# Patient Record
Sex: Male | Born: 1970 | Race: Black or African American | Hispanic: No | Marital: Married | Smoking: Never smoker
Health system: Southern US, Community
[De-identification: ages and names within clinical notes are randomized; demographics above are authoritative.]

## PROBLEM LIST (undated history)

## (undated) DIAGNOSIS — K219 Gastro-esophageal reflux disease without esophagitis: Secondary | ICD-10-CM

## (undated) DIAGNOSIS — R351 Nocturia: Secondary | ICD-10-CM

## (undated) DIAGNOSIS — G4700 Insomnia, unspecified: Secondary | ICD-10-CM

## (undated) DIAGNOSIS — R7989 Other specified abnormal findings of blood chemistry: Secondary | ICD-10-CM

## (undated) DIAGNOSIS — R011 Cardiac murmur, unspecified: Secondary | ICD-10-CM

## (undated) DIAGNOSIS — E559 Vitamin D deficiency, unspecified: Secondary | ICD-10-CM

## (undated) HISTORY — DX: Cardiac murmur, unspecified: R01.1

## (undated) HISTORY — PX: COLONOSCOPY: SHX174

## (undated) HISTORY — DX: Nocturia: R35.1

## (undated) HISTORY — DX: Vitamin D deficiency, unspecified: E55.9

## (undated) HISTORY — DX: Insomnia, unspecified: G47.00

## (undated) HISTORY — DX: Gastro-esophageal reflux disease without esophagitis: K21.9

## (undated) HISTORY — DX: Other specified abnormal findings of blood chemistry: R79.89

---

## 2007-12-15 ENCOUNTER — Ambulatory Visit: Payer: Self-pay | Admitting: Cardiology

## 2007-12-15 ENCOUNTER — Ambulatory Visit: Payer: Self-pay

## 2010-03-31 HISTORY — PX: APPENDECTOMY: SHX54

## 2010-04-24 ENCOUNTER — Observation Stay (HOSPITAL_COMMUNITY): Admission: EM | Admit: 2010-04-24 | Discharge: 2010-04-25 | Payer: Self-pay | Admitting: Emergency Medicine

## 2011-02-17 LAB — DIFFERENTIAL
Basophils Absolute: 0 10*3/uL (ref 0.0–0.1)
Eosinophils Relative: 0 % (ref 0–5)
Lymphocytes Relative: 13 % (ref 12–46)
Lymphs Abs: 1.8 10*3/uL (ref 0.7–4.0)
Monocytes Absolute: 1.5 10*3/uL — ABNORMAL HIGH (ref 0.1–1.0)
Monocytes Relative: 11 % (ref 3–12)
Neutro Abs: 10 10*3/uL — ABNORMAL HIGH (ref 1.7–7.7)

## 2011-02-17 LAB — BASIC METABOLIC PANEL
Calcium: 9 mg/dL (ref 8.4–10.5)
GFR calc Af Amer: >60 mL/min (ref >60)
GFR calc non Af Amer: >60 mL/min (ref >60)
Potassium: 3.7 mEq/L (ref 3.5–5.1)
Sodium: 135 mEq/L (ref 135–145)

## 2011-02-17 LAB — CBC
HCT: 42 % (ref 39.0–52.0)
Hemoglobin: 13.7 g/dL (ref 13.0–17.0)
RBC: 5.02 MIL/uL (ref 4.22–5.81)
RDW: 15.2 % (ref 11.5–15.5)
WBC: 13.4 10*3/uL — ABNORMAL HIGH (ref 4.0–10.5)

## 2011-04-15 NOTE — Procedures (Signed)
James Townsend                              EXERCISE James Townsend, James Townsend                          MRN:          [id number]  DATE:12/15/2007                            DOB:          December 14, 1970    INDICATION FOR STUDY:  786.50.   Baseline blood pressure is 114/81, pulse 81 and regular.  Baseline ECG  is normal.   He exercised on a Bruce protocol for 10 minutes completing 1 minute of  stage IV and MET level achieved was 11.7, peak heart rate was 187 beats  per minute which is 101% of maximum heart rate.  Blood pressure went to  196/58 and it came down appropriately in recovery.   He had no chest pain.  Test was stopped secondary to reaching adequate  inpoint.   Electrocardiogram showed no ischemic changes.  There were no  arrhythmias.   CONCLUSION:  1. Negative adequate exercise treadmill.  2. Relatively good exercise tolerance.  3. Physical deconditioning.   Discussed results with the patient.  I have recommended a regular  exercise program and weight reduction.     Thomas C. Daleen Squibb, MD, [facility]  Electronically Signed    TCW/MedQ  DD: 12/15/2007  DT: 12/15/2007  Job #: [id number]   cc:   Florencia Reasons, MD

## 2012-11-01 ENCOUNTER — Ambulatory Visit: Payer: Self-pay | Admitting: Family Medicine

## 2012-11-08 ENCOUNTER — Ambulatory Visit (INDEPENDENT_AMBULATORY_CARE_PROVIDER_SITE_OTHER): Payer: BC Managed Care – PPO | Admitting: Family Medicine

## 2012-11-08 ENCOUNTER — Encounter: Payer: Self-pay | Admitting: Family Medicine

## 2012-11-08 VITALS — BP 120/86 | HR 56 | Temp 98.2°F | Ht 68.5 in | Wt 213.0 lb

## 2012-11-08 DIAGNOSIS — G4700 Insomnia, unspecified: Secondary | ICD-10-CM | POA: Insufficient documentation

## 2012-11-08 DIAGNOSIS — R350 Frequency of micturition: Secondary | ICD-10-CM | POA: Insufficient documentation

## 2012-11-08 DIAGNOSIS — N419 Inflammatory disease of prostate, unspecified: Secondary | ICD-10-CM

## 2012-11-08 DIAGNOSIS — R5383 Other fatigue: Secondary | ICD-10-CM

## 2012-11-08 DIAGNOSIS — R5381 Other malaise: Secondary | ICD-10-CM

## 2012-11-08 LAB — POCT URINALYSIS DIPSTICK
Bilirubin, UA: NEGATIVE
Blood, UA: NEGATIVE
Glucose, UA: NEGATIVE
Ketones, UA: NEGATIVE
Nitrite, UA: NEGATIVE

## 2012-11-08 MED ORDER — CIPROFLOXACIN HCL 500 MG PO TABS: 500.0000 mg | ORAL_TABLET | Freq: Two times a day (BID) | ORAL | Status: DC

## 2012-11-08 NOTE — Progress Notes (Signed)
Chief Complaint  Patient presents with  . Consult    new patient complains of having no energy, not sleeping well and having frequent urination.   HPI: Patient presents with complaint of fatigue.  He thinks it is related to the fact that he doesn't sleep well.  It takes him a long time to fall asleep, sometimes up to 2 hours, and then once asleep, sleep is interrupted by frequent urination, getting up 4 times/night.  He drinks about 16 ounces of fluid in the evenings (after dinnertime), sometimes water, sometimes coffee. He drinks about 3 cups/coffee daily, and 1 other caffeinated beverage daily.  2 servings are before noon, soda at lunch (around 2-3pm), sometimes has another coffee around 6-7pm.  Admits to drinking a lot of water throughout the day. He has urinary frequency during the day, as well as at night.  Denies any burning with urination.  Symptoms of nocturia having been going on for a couple of months.  Problems falling asleep even longer.  Sometimes stream is weaker, sometimes has hesitancy in starting stream.  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  He has trouble falling asleep at night.  He exercises in the mornings.  He has tried chamomille tea without benefit.  He thinks he tried Z-quil without benefit.  He also tried melatonin, can't recall but thinks he felt draggy the next day (he may have gotten those meds/effects mixed up, he really couldn't recall names).  He started taking vitamin D about a month ago, since he isn't out in the sun much.  Hasn't really noticed a difference in energy.  He had labwork done through work in October--didn't bring the results.  He recalls that it was all okay.  Last CPE was 2 years ago at [facility] [place].  Past Medical History  Diagnosis Date  . GERD (gastroesophageal reflux disease)     15 yrs ago, better with dietary changes  . Heart murmur     normal evaluation in past (cardiologist)   Past Surgical  History  Procedure Date  . Appendectomy 03/2010   History   Social History  . Marital Status: Married    Spouse Name: N/A    Number of Children: 3  . Years of Education: N/A   Occupational History  . truck [employer]   Social History Main Topics  . Smoking status: Never Smoker   . Smokeless tobacco: Never Used  . Alcohol Use: No  . Drug Use: No  . Sexually Active: Not on file   Other Topics Concern  . Not on file   Social History Narrative   Lives with wife, 2/3 children (oldest in college).  2 dogs   Family History  Problem Relation Age of Onset  . Cancer Mother     breast cancer in 63's  . Hypertension Maternal Grandmother   . Diabetes Neg Hx   . Stroke Neg Hx   . Heart disease Daughter     "hole in heart", congenital, resolved on its own   Current outpatient prescriptions:Cholecalciferol (VITAMIN D) 1000 UNITS capsule, Take 1,000 Units by mouth daily., Disp: , Rfl:   No Known Allergies  ROS: Denies allergy symptoms, URI symptoms, fevers, nausea, vomiting, diarrhea, abdominal pain, bowel changes, depression, anxiety.  Some constipation. Denies chest pain, palpitations, shortness of breath, edema ,bleeding/bruising, skin rash.  Denies joint pains, neuro complaints, headaches, dizziness or other concerns.  See HPI.  PHYSICAL EXAM: BP 120/86  Pulse 56  Temp 98.2 F (36.8 C) (Oral)  Ht 5' 8.5" (1.74 m)  Wt 213 lb (96.616 kg)  BMI 31.92 kg/m2 Well developed, pleasant, slightly overweight male in no distress HEENT:  PERRL, EOMI, conjunctiva clear.  OP clear Neck: no lymphadenopathy, thyromegaly or mass Heart: regular rate and rhythm with 1-2/6 SEM at apex Lungs: clear bilaterally Back: no CVA or spinal tenderness Abdomen: soft, nontender, no organomegaly or mass Extremities: no edema Skin: no rash Rectal exam: Prostate--moderately enlarged and boggy, nontender  ASSESSMENT/PLAN: 1. Fatigue    2. Urinary frequency  POCT Urinalysis Dipstick  3.  Prostatitis  ciprofloxacin (CIPRO) 500 MG tablet  4. Insomnia      Decrease fluids in the evenings.  Don't have caffeine after noon.   Try melatonin at bedtime to help you fall asleep. Discussed relaxation/calming techniques.  Take cipro twice daily for 2 weeks to treat possible prostate infection that is causing urinary frequency. It is possible that there may also be some underlying enlargement not related to infection.  If your urine symptoms don't improve with the antibiotics, try taking saw palmetto (this is an herbal treatment that helps with symptoms of enlarged prostate). May need to consider rx treatment for BPH if ongoing symptoms.  Call for refill of antibiotic if your symptoms completely resolve, but recur within a week or so after stopping the antibiotic.  Please fax/mail/drop off copies of your recent work labs for my review (to determine if additional bloodwork is necessary).

## 2012-11-08 NOTE — Patient Instructions (Addendum)
Please fax/mail/drop off copies of your recent work labs for my review (to determine if additional bloodwork is necessary).   Decrease fluids in the evenings.  Don't have caffeine after noon.   Try melatonin at bedtime to help you fall asleep. Take cipro twice daily for 2 weeks to treat possible prostate infection that is causing urinary frequency. It is possible that there may also be some underlying enlargement not related to infection.  If your urine symptoms don't improve with the antibiotics, try taking saw palmetto (this is an herbal treatment that helps with symptoms of enlarged prostate)  Call for refill of antibiotic if your symptoms completely resolve, but recur within a week or so after stopping the antibiotic.  Call is if symptoms recur.  Call us if you have NO improvement after 1-2 weeks of medication.   Prostatitis The prostate gland is about the size and shape of a walnut. It is located just below your bladder. It produces one of the components of semen, which is made up of sperm and the fluids that help nourish and transport it out from the testicles. Prostatitis is redness, soreness, and swelling (inflammation) of the prostate gland.  There are 3 types of prostatitis:  Acute bacterial prostatitis This is the least common type of prostatitis. It starts quickly and usually leads to a bladder infection. It can occur at any age.  Chronic bacterial prostatitis This is a persistent bacterial infection in the prostate.It usually develops from repeated acute bacterial prostatitis or acute bacterial prostatitis that was not properly treated. It can occur in men of any age but is most common in middle-aged men whose prostate has begun to enlarge.  Chronic prostatitis chronic pelvic pain syndrome This is the most common type of prostatitis. It is inflammation of the prostate gland that is not caused by a bacterial infection. The cause is unknown. CAUSES The cause of acute and chronic  bacterial prostatitis is a bacterial infection. The exact cause of chronic prostatitis and chronic pelvic pain syndrome and asymptomatic inflammatory prostatitis is unknown.  SYMPTOMS  Symptoms can vary depending upon the type of prostatitis that exists. There can also be overlap in symptoms. Possible symptoms for each type of prostatitis are listed below. Acute bacterial prostatitis  Painful urination.  Fever or chills.  Muscle or joint pains.  Low back pain.  Low abdominal pain.  Inability to empty bladder completely.  Sudden urge to urinate.  Frequent urination.  Difficulty starting urine stream.  Weak urine stream.  Discharge from the urethra.  Dribbling after urination.  Rectal pain.  Pain in the testicles, penis, or tip of the penis.  Pain in the space between the anus and scrotum (perineum).  Problems with sexual function.  Painful ejaculation.  Bloody semen. Chronic bacterial prostatitis  The symptoms are similar to those of acute bacterial prostatitis, but they usually are much less severe. Fever, chills, and muscle and joint pain are not associated with chronic bacterial prostatitis. Chronic prostatitis chronic pelvic pain syndrome  Symptoms typically include a dull ache in the scrotum and the perineum. DIAGNOSIS  In order to diagnose prostatitis, your caregiver will ask about your symptoms. If acute or chronic bacterial prostatitis is suspected, a urine sample will be taken and tested (urinalysis). This is to see if there is bacteria in your urine. If the urinalysis result is negative for bacteria, your caregiver may use a finger to feel your prostate (digital rectal exam). This exam helps your caregiver determine if your prostate  is swollen and tender. TREATMENT  Treatment for prostatitis depends on the cause. If a bacterial infection is the cause, it can be treated with antibiotic medicine. In cases of chronic bacterial prostatitis, the use of antibiotics  for up to 1 month may be necessary. Your caregiver may instruct you to take sitz baths to help relieve pain. A sitz bath is a bath of hot water in which your hips and buttocks are under water. HOME CARE INSTRUCTIONS   Take all medicines as directed by your caregiver.  Take sitz baths as directed by your caregiver. SEEK MEDICAL CARE IF:   Your symptoms get worse, not better.  You have a fever. SEEK IMMEDIATE MEDICAL CARE IF:   You have chills.  You feel nauseous or vomit.  You feel lightheaded or faint.  You are unable to urinate.  You have blood or blood clots in your urine. Document Released: 11/14/2000 Document Revised: 02/09/2012 Document Reviewed: 10/20/2011 [facility] Patient Information 2013 [place], [place].

## 2012-11-25 ENCOUNTER — Other Ambulatory Visit: Payer: Self-pay | Admitting: Family Medicine

## 2012-12-01 DIAGNOSIS — R7989 Other specified abnormal findings of blood chemistry: Secondary | ICD-10-CM

## 2012-12-01 HISTORY — DX: Other specified abnormal findings of blood chemistry: R79.89

## 2013-02-11 ENCOUNTER — Encounter: Payer: Self-pay | Admitting: Family Medicine

## 2013-03-09 ENCOUNTER — Telehealth: Payer: Self-pay | Admitting: Family Medicine

## 2013-03-09 NOTE — Telephone Encounter (Signed)
Received a records request from [facility]/payment was given and records were sent to  [phone]

## 2013-07-25 ENCOUNTER — Ambulatory Visit (INDEPENDENT_AMBULATORY_CARE_PROVIDER_SITE_OTHER): Payer: BC Managed Care – PPO | Admitting: Family Medicine

## 2013-07-25 ENCOUNTER — Encounter: Payer: Self-pay | Admitting: Family Medicine

## 2013-07-25 VITALS — BP 116/70 | HR 56 | Temp 98.1°F | Ht 68.25 in | Wt 221.0 lb

## 2013-07-25 DIAGNOSIS — R5381 Other malaise: Secondary | ICD-10-CM

## 2013-07-25 DIAGNOSIS — G4700 Insomnia, unspecified: Secondary | ICD-10-CM

## 2013-07-25 LAB — COMPREHENSIVE METABOLIC PANEL
ALT: 19 U/L (ref 0–53)
AST: 14 U/L (ref 0–37)
Albumin: 4.4 g/dL (ref 3.5–5.2)
BUN: 11 mg/dL (ref 6–23)
CO2: 31 mEq/L (ref 19–32)
Calcium: 9.1 mg/dL (ref 8.4–10.5)
Chloride: 103 mEq/L (ref 96–112)
Potassium: 4.2 mEq/L (ref 3.5–5.3)

## 2013-07-25 LAB — CBC WITH DIFFERENTIAL/PLATELET
Basophils Absolute: 0 10*3/uL (ref 0.0–0.1)
Basophils Relative: 0 % (ref 0–1)
Hemoglobin: 13.4 g/dL (ref 13.0–17.0)
MCHC: 33.8 g/dL (ref 30.0–36.0)
Neutro Abs: 1.4 10*3/uL — ABNORMAL LOW (ref 1.7–7.7)
Neutrophils Relative %: 42 % — ABNORMAL LOW (ref 43–77)
RDW: 14.8 % (ref 11.5–15.5)
WBC: 3.5 10*3/uL — ABNORMAL LOW (ref 4.0–10.5)

## 2013-07-25 LAB — TSH: TSH: 1.903 u[IU]/mL (ref 0.350–4.500)

## 2013-07-25 MED ORDER — TRAZODONE HCL 50 MG PO TABS: 50.0000 mg | ORAL_TABLET | Freq: Every evening | ORAL | Status: DC | PRN

## 2013-07-25 NOTE — Progress Notes (Signed)
Chief Complaint  Patient presents with  . Fatigue    patient has been very fatigued x 7-8 months but has worsened over the last 2 months. Also cannot sleep very well. And he is having some back pain-mid back, stiffness especially in the morning time x 3 months.    He continues to feel fatigued during the day even if he had rested all weekend, and caught up on sleep.  It takes him about an hour to fall asleep at night.  He gets up twice to go to the bathroom, and often gets back to sleep easily.  He reports he is getting about 4 hours of sleep per night.  Gets to bed around 10:30-11, asleep by midnight, and sleeps until 6:30 in the morning (so likely more than the 4 hrs he states).  He wakes up still feeling a little tired, and recently has remained tired throughout the day (for the last 2-3 weeks).  He thinks he only snores after a hard/long day at work. Wife never mentioned any apnea to him.  He feels he is tired due to not sleeping well enough, not getting uninterrupted sleep for 7-8 hours which he desires.  He has tried chamomille tea, which maybe helped just a little, relaxed him.  He has tried melatonin, which helped some at first, but then stopped working.  He complaints of back stiffness in mornings--discomfort in the lower back when he wakes up, which resolves on its own.  He doesn't think that back pain wakes him up at night or interrupts his sleep.  Denies any radiation of pain, or any numbness or tingling or weakness down the leg.  No dysuria or hematuria.  Past Medical History  Diagnosis Date  . GERD (gastroesophageal reflux disease)     15 yrs ago, better with dietary changes  . Heart murmur     normal evaluation in past (cardiologist)   Past Surgical History  Procedure Laterality Date  . Appendectomy  03/2010   History   Social History  . Marital Status: Married    Spouse Name: N/A    Number of Children: 3  . Years of Education: N/A   Occupational History  . truck [employer]   Social History Main Topics  . Smoking status: Never Smoker   . Smokeless tobacco: Never Used  . Alcohol Use: No  . Drug Use: No  . Sexual Activity: Not on file   Other Topics Concern  . Not on file   Social History Narrative   Lives with wife, 2/3 children (oldest in college).  2 dogs   Current outpatient prescriptions:traZODone (DESYREL) 50 MG tablet, Take 1-2 tablets (50-100 mg total) by mouth at bedtime as needed for sleep., Disp: 60 tablet, Rfl: 2  No Known Allergies  ROS: denies fevers, congestion/allergies/URI symptoms. No cough, shortness of breath, wheezing, chest pain, bleeding/bruising, rashes, joint pains other than the back stiffness per HPI.  Denies depression or anxiety. No bowel changes, hair/skin changes.  He has gained 8 pounds since December.  PHYSICAL EXAM: BP 116/70  Pulse 56  Temp(Src) 98.1 F (36.7 C) (Oral)  Ht 5' 8.25" (1.734 m)  Wt 221 lb (100.245 kg)  BMI 33.34 kg/m2 Pleasant, obese male in no distress HEENT:  PERRL, EOMI, conjunctiva clear.  Sinuses nontender, OP clear Neck: no lymphadenopathy, thyromegaly or mass Back: Spine nontender, no CVA tenderness Heart: regular rate and rhythm without murmur Lungs: clear bilaterally Abdomen: soft, nontender Extremities: no edema Skin: no rashes  Psych: normal mood, affect, hygiene and grooming Neuro: alert and oriented.  Cranial nerves grossly intact. Normal gait.  ASSESSMENT/PLAN:  Insomnia - Plan: traZODone (DESYREL) 50 MG tablet  Other malaise and fatigue - Plan: Comprehensive metabolic panel, CBC with Differential, Vit D  25 hydroxy (rtn osteoporosis monitoring), TSH, Testosterone   Fatigue--partly contributed to inadequate sleep.  Rule out other causes.  Ask wife about snoring and if you stop breathing at night.  If so, you will need a sleep study. I recommend either retrying the melatonin (taking it every night) to help you fall asleep easier, OR trying a medication  containing diphenhydramine (Z-quil, or any of the PM medications, which is really just benadryl).  Take these at 9:30-10pm at night (not at midnight when you can't sleep).  If these aren't helpful, then trial of trazadone.  1-2 tablets qHS.  Sleep hygiene reviewed.  Back pain--briefly reviewed.  Stiffness in morning only, not affecting sleep.  Discussed, heat, stretches, possibly related to mattress.  Tylenol or OTC NSAIDs prn.  Reassured.  25-30 min visit, more than 1/2 spent counseling.

## 2013-07-25 NOTE — Patient Instructions (Addendum)
Insomnia Insomnia is frequent trouble falling and/or staying asleep. Insomnia can be a long term problem or a short term problem. Both are common. Insomnia can be a short term problem when the wakefulness is related to a certain stress or worry. Long term insomnia is often related to ongoing stress during waking hours and/or poor sleeping habits. Overtime, sleep deprivation itself can make the problem worse. Every little thing feels more severe because you are overtired and your ability to cope is decreased. CAUSES   Stress, anxiety, and depression.  Poor sleeping habits.  Distractions such as TV in the bedroom.  Naps close to bedtime.  Engaging in emotionally charged conversations before bed.  Technical reading before sleep.  Alcohol and other sedatives. They may make the problem worse. They can hurt normal sleep patterns and normal dream activity.  Stimulants such as caffeine for several hours prior to bedtime.  Pain syndromes and shortness of breath can cause insomnia.  Exercise late at night.  Changing time zones may cause sleeping problems (jet lag). It is sometimes helpful to have someone observe your sleeping patterns. They should look for periods of not breathing during the night (sleep apnea). They should also look to see how long those periods last. If you live alone or observers are uncertain, you can also be observed at a sleep clinic where your sleep patterns will be professionally monitored. Sleep apnea requires a checkup and treatment. Give your caregivers your medical history. Give your caregivers observations your family has made about your sleep.  SYMPTOMS   Not feeling rested in the morning.  Anxiety and restlessness at bedtime.  Difficulty falling and staying asleep. TREATMENT   Your caregiver may prescribe treatment for an underlying medical disorders. Your caregiver can give advice or help if you are using alcohol or other drugs for self-medication. Treatment  of underlying problems will usually eliminate insomnia problems.  Medications can be prescribed for short time use. They are generally not recommended for lengthy use.  Over-the-counter sleep medicines are not recommended for lengthy use. They can be habit forming.  You can promote easier sleeping by making lifestyle changes such as:  Using relaxation techniques that help with breathing and reduce muscle tension.  Exercising earlier in the day.  Changing your diet and the time of your last meal. No night time snacks.  Establish a regular time to go to bed.  Counseling can help with stressful problems and worry.  Soothing music and white noise may be helpful if there are background noises you cannot remove.  Stop tedious detailed work at least one hour before bedtime. HOME CARE INSTRUCTIONS   Keep a diary. Inform your caregiver about your progress. This includes any medication side effects. See your caregiver regularly. Take note of:  Times when you are asleep.  Times when you are awake during the night.  The quality of your sleep.  How you feel the next day. This information will help your caregiver care for you.  Get out of bed if you are still awake after 15 minutes. Read or do some quiet activity. Keep the lights down. Wait until you feel sleepy and go back to bed.  Keep regular sleeping and waking hours. Avoid naps.  Exercise regularly.  Avoid distractions at bedtime. Distractions include watching television or engaging in any intense or detailed activity like attempting to balance the household checkbook.  Develop a bedtime ritual. Keep a familiar routine of bathing, brushing your teeth, climbing into bed at the same   time each night, listening to soothing music. Routines increase the success of falling to sleep faster.  Use relaxation techniques. This can be using breathing and muscle tension release routines. It can also include visualizing peaceful scenes. You can  also help control troubling or intruding thoughts by keeping your mind occupied with boring or repetitive thoughts like the old concept of counting sheep. You can make it more creative like imagining planting one beautiful flower after another in your backyard garden.  During your day, work to eliminate stress. When this is not possible use some of the previous suggestions to help reduce the anxiety that accompanies stressful situations. MAKE SURE YOU:   Understand these instructions.  Will watch your condition.  Will get help right away if you are not doing well or get worse. Document Released: 11/14/2000 Document Revised: 02/09/2012 Document Reviewed: 12/15/2007 [facility] Patient Information 2014 [place], [place].   Ask wife about snoring and if you stop breathing at night.  If so, you will need a sleep study. I recommend either retrying the melatonin (taking it every night) to help you fall asleep easier, OR trying a medication containing diphenhydramine (Z-quil, or any of the PM medications, which is really just benadryl).  Take these at 9:30-10pm at night (not at midnight when you can't sleep).  If these aren't effective, then try Trazadone.  Take 1-2 tablets at bedtime, if needed for sleep.

## 2013-07-26 ENCOUNTER — Encounter: Payer: Self-pay | Admitting: Family Medicine

## 2013-07-26 DIAGNOSIS — E559 Vitamin D deficiency, unspecified: Secondary | ICD-10-CM | POA: Insufficient documentation

## 2013-07-26 DIAGNOSIS — R7989 Other specified abnormal findings of blood chemistry: Secondary | ICD-10-CM | POA: Insufficient documentation

## 2013-07-26 LAB — VITAMIN D 25 HYDROXY (VIT D DEFICIENCY, FRACTURES): Vit D, 25-Hydroxy: 16 ng/mL — ABNORMAL LOW (ref 30–89)

## 2013-07-27 ENCOUNTER — Other Ambulatory Visit: Payer: Self-pay | Admitting: *Deleted

## 2013-07-27 DIAGNOSIS — E559 Vitamin D deficiency, unspecified: Secondary | ICD-10-CM

## 2013-07-27 DIAGNOSIS — R7989 Other specified abnormal findings of blood chemistry: Secondary | ICD-10-CM

## 2013-07-27 MED ORDER — ERGOCALCIFEROL 1.25 MG (50000 UT) PO CAPS: 50000.0000 [IU] | ORAL_CAPSULE | ORAL | Status: DC

## 2013-07-27 NOTE — Telephone Encounter (Signed)
Called in vit d 50,000IU x 12 weeks, scheduled patient for am labs 11/15/13 @ 8:30 am-future orders placed.

## 2013-11-15 ENCOUNTER — Other Ambulatory Visit: Payer: BC Managed Care – PPO

## 2013-11-15 ENCOUNTER — Telehealth: Payer: Self-pay | Admitting: Family Medicine

## 2013-11-15 DIAGNOSIS — R7989 Other specified abnormal findings of blood chemistry: Secondary | ICD-10-CM

## 2013-11-15 DIAGNOSIS — E559 Vitamin D deficiency, unspecified: Secondary | ICD-10-CM

## 2013-11-15 NOTE — Telephone Encounter (Signed)
He needs to set up a follow-up visit--f/u labs, discuss his fatigue, insomnia

## 2013-11-15 NOTE — Telephone Encounter (Signed)
Patient came in this morning for lab work. He states that the trazodone makes him feel nausea. He said he started the medication then he stop it and now he has tried to restart the medication and it is still making him feel nausea. CLS

## 2013-11-15 NOTE — Telephone Encounter (Signed)
Patient is aware and he will call back to schedule his follow up appointment. CLS

## 2013-11-30 ENCOUNTER — Ambulatory Visit (INDEPENDENT_AMBULATORY_CARE_PROVIDER_SITE_OTHER): Payer: BC Managed Care – PPO | Admitting: Family Medicine

## 2013-11-30 ENCOUNTER — Encounter: Payer: Self-pay | Admitting: Family Medicine

## 2013-11-30 VITALS — BP 132/82 | HR 96 | Ht 69.5 in | Wt 234.0 lb

## 2013-11-30 DIAGNOSIS — R351 Nocturia: Secondary | ICD-10-CM

## 2013-11-30 DIAGNOSIS — E559 Vitamin D deficiency, unspecified: Secondary | ICD-10-CM

## 2013-11-30 DIAGNOSIS — E291 Testicular hypofunction: Secondary | ICD-10-CM

## 2013-11-30 DIAGNOSIS — R7989 Other specified abnormal findings of blood chemistry: Secondary | ICD-10-CM

## 2013-11-30 DIAGNOSIS — G4700 Insomnia, unspecified: Secondary | ICD-10-CM

## 2013-11-30 DIAGNOSIS — Z125 Encounter for screening for malignant neoplasm of prostate: Secondary | ICD-10-CM

## 2013-11-30 MED ORDER — ERGOCALCIFEROL 1.25 MG (50000 UT) PO CAPS: 50000.0000 [IU] | ORAL_CAPSULE | ORAL | Status: DC

## 2013-11-30 MED ORDER — ZOLPIDEM TARTRATE 10 MG PO TABS: 10.0000 mg | ORAL_TABLET | Freq: Every evening | ORAL | Status: DC | PRN

## 2013-11-30 NOTE — Progress Notes (Signed)
Chief Complaint  Patient presents with  . follow-up    follow-up on low testosterone and vitamin D    Insomnia:  He is having trouble falling asleep at night.  He gets up to go to the bathroom 4-5 times/night.  Sometimes he is up more.  He has tried to cut back on fluid intake in the evenings, but sometimes will forget and drink more, or other times just small sips for dry mouth.  He denies urinary frequency during the day (only if he drinks coffee in the morning). When he was here in August, he was only getting up twice a night to void.  10/2012 he was treated with cipro for a prostatitis, when going every 2 hrs. Denies any dysuria, odor to urine.  If he drinks soda, he has more urinary hesitancy to his stream.  Stream is weaker, but no dribbling.  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 If not holding excessively, then seems fine. He tried trazadone to help with sleep--it made him woozy, nauseated, and didn't like how he felt.  He only took it for 2 or 3 nights.   Sometimes he will have a good night sleep, sleeps deeply, and will only get up once to void, and able to get back to sleep.  He did talk to his wife about his sleeping--he snores occasionally, denies apnea.  He had been getting regular exercise--was doing daily, but stopped about 2 months ago.  Hasn't had the energy lately, only exercising about once a week. When he was exercising regularly his sleep was better than it is now.  He used to get up and walk every morning, and he wants to, but can't drag himself to do it, doesn't have the energy now.  Libido is okay, and denies erectile dysfunction.    Vitamin D deficiency.  He believes he only took prescription for 4 weeks (doesn't think bottle had 12 pills, wasn't aware he was supposed to get refills).  He hasn't been taking any OTC vitamin D supplement.  Past Medical History  Diagnosis Date  . GERD  (gastroesophageal reflux disease)     15 yrs ago, better with dietary changes  . Heart murmur     normal evaluation in past (cardiologist)  . Nocturia   . Low testosterone 2014  . Insomnia    Past Surgical History  Procedure Laterality Date  . Appendectomy  03/2010   History   Social History  . Marital Status: Married    Spouse Name: N/A    Number of Children: 3  . Years of Education: N/A   Occupational History  . truck [employer]   Social History Main Topics  . Smoking status: Never Smoker   . Smokeless tobacco: Never Used  . Alcohol Use: No  . Drug Use: No  . Sexual Activity: Not on file   Other Topics Concern  . Not on file   Social History Narrative   Lives with wife, 2/3 children (oldest in college).  2 dogs   No current outpatient prescriptions on file prior to visit.   No current facility-administered medications on file prior to visit.   No Known Allergies  ROS: denies fevers, chills, URI symptoms, cough, shortness of breath, chest pain, GI complaints, GU complaints. +nocturia, +insomnia, +fatigue, as per HPI.  Denies depression, ED, decreased libido, bleeding/bruising, rashes, or other concerns.  PHYSICAL  EXAM: BP 132/82  Pulse 96  Ht 5' 9.5" (1.765 m)  Wt 234 lb (106.142 kg)  BMI 34.07 kg/m2 Well developed, well-appearing, pleasant male in no distress Rectal exam--very tight sphincter tone. Prostate was not enlarged, no nodule or mass could be appreciated.  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  trial of Axiron - Plan: PSA, Prolactin, Testosterone 30 MG/ACT SOLN, CANCELED: PSA, CANCELED: Prolactin  Low testosterone  Unspecified vitamin D deficiency - restart rx replacement x 12 wks, then start OTC supplementation, longterm - Plan:  ergocalciferol (VITAMIN D2) 50000 UNITS capsule  Insomnia - exacerbated by nocturia, but initial insomnia also.  trial of Ambien.  risks/side effects reviewed.  for prn/sporadic use - Plan: zolpidem (AMBIEN) 10 MG tablet  Special screening for malignant neoplasm of prostate - Plan: PSA, CANCELED: PSA  Nocturia - limit caffeine and fluids.  trial of saw palmetto.  no e/o infection on exam  Discussed risks/side effects of ambien and testosterone replacement at length. Discussed increased risk for osteoporosis if not treated (for both).    Start testosterone replacement--will likely need prior auth.  Will be getting [place] insurance effective January 1st. Given sample of Axiron  Unable to get baseline PSA (and prolactin, looking for other signs of pituitary dysfunction), due to [facility] having left for the day--he will return for nonfasting labs soon.  Sleep hygiene reviewed. Encouraged him to restart daily exercise.  Total visit time was 30 minutes, more than 1/2 spent counseling

## 2013-11-30 NOTE — Patient Instructions (Addendum)
Limit fluids and caffeine to help decrease frequency of urination at nighttime. Try taking saw palmetto--a supplement that can sometimes help with prostate symtpoms.  Take ambien at bedtime to help with sleep.  Start at 1/2 tablet, and increase to full tablet if 1/2 isn't effective.  Try and use this medication only as needed.  This is not supposed to be used daily, longterm, just short term or intermittent use.  Hopefully your sleep will improve as you get back to a regular exercise routine.  Your energy should improve with getting vitamin D and testosterone levels back to normal.  It is important to take all 12 weeks of the prescription vitamin D, and then to get an over-the-counter 1000 IU vitamin D, that you will need to take every day longterm (once you finish the prescription).   Return for nonfasting bloodwork within the next week or so (I'm sorry we couldn't draw it today).  Start the Axiron sample you were given, and we will work on getting authorization for a prescription to the pharmacy

## 2013-12-01 ENCOUNTER — Encounter: Payer: Self-pay | Admitting: Family Medicine

## 2013-12-01 DIAGNOSIS — E291 Testicular hypofunction: Secondary | ICD-10-CM | POA: Insufficient documentation

## 2013-12-01 DIAGNOSIS — R351 Nocturia: Secondary | ICD-10-CM | POA: Insufficient documentation

## 2013-12-01 MED ORDER — TESTOSTERONE 30 MG/ACT TD SOLN: TRANSDERMAL | Status: DC

## 2013-12-02 ENCOUNTER — Other Ambulatory Visit: Payer: Managed Care, Other (non HMO)

## 2013-12-02 DIAGNOSIS — E291 Testicular hypofunction: Secondary | ICD-10-CM

## 2013-12-02 DIAGNOSIS — Z125 Encounter for screening for malignant neoplasm of prostate: Secondary | ICD-10-CM

## 2013-12-03 LAB — PROLACTIN: PROLACTIN: 10.2 ng/mL (ref 2.1–17.1)

## 2013-12-03 LAB — PSA: PSA: 0.62 ng/mL (ref <=4.00)

## 2013-12-05 ENCOUNTER — Other Ambulatory Visit: Payer: Self-pay | Admitting: *Deleted

## 2013-12-05 DIAGNOSIS — E291 Testicular hypofunction: Secondary | ICD-10-CM

## 2013-12-05 MED ORDER — TESTOSTERONE 30 MG/ACT TD SOLN: TRANSDERMAL | Status: DC

## 2013-12-05 NOTE — Telephone Encounter (Signed)
Called in.

## 2013-12-12 ENCOUNTER — Telehealth: Payer: Self-pay | Admitting: Family Medicine

## 2013-12-13 NOTE — Telephone Encounter (Signed)
P.A. Randa NgoAxiron approved til 12/12/14, faxed pharmacy and pt informed

## 2014-03-02 ENCOUNTER — Ambulatory Visit (INDEPENDENT_AMBULATORY_CARE_PROVIDER_SITE_OTHER): Payer: Managed Care, Other (non HMO) | Admitting: Family Medicine

## 2014-03-02 ENCOUNTER — Encounter: Payer: Self-pay | Admitting: Family Medicine

## 2014-03-02 VITALS — BP 104/74 | HR 72 | Ht 68.25 in | Wt 218.0 lb

## 2014-03-02 DIAGNOSIS — G4700 Insomnia, unspecified: Secondary | ICD-10-CM

## 2014-03-02 DIAGNOSIS — E559 Vitamin D deficiency, unspecified: Secondary | ICD-10-CM

## 2014-03-02 DIAGNOSIS — E291 Testicular hypofunction: Secondary | ICD-10-CM

## 2014-03-02 DIAGNOSIS — R5381 Other malaise: Secondary | ICD-10-CM | POA: Insufficient documentation

## 2014-03-02 DIAGNOSIS — M545 Low back pain, unspecified: Secondary | ICD-10-CM

## 2014-03-02 DIAGNOSIS — R5383 Other fatigue: Secondary | ICD-10-CM

## 2014-03-02 NOTE — Patient Instructions (Signed)
Try and do back stretches and core strengthening exercises to help your back.  Consider yoga, pilates.    Try and get at least 3 minutes of exercise (cardio) daily.  It is important that you get more sleep.  Your ongoing fatigue is partly related to inadequate sleep.   We will recheck your vitamin D and see if those numbers are improved.  When you finish the prescription, remember to start over-the-counter vitamin D every day, long-term (1000-2000 IU every day).

## 2014-03-02 NOTE — Progress Notes (Signed)
Chief Complaint  Patient presents with  . Hypogonadism    3 month nonfasting med check.    Vitamin D deficiency:  Last Vitamin D-OH was 20.  He reports having taken 2 months of weekly prescription, hasn't picked up the last refill yet. Not taking OTC supplement.  Insomnia:  He takes 1/2 tablet at night prn for insomnia.  He falls asleep better if he doesn't eat anything within 4 hours of going to bed.  Only used the [place] about 2[place]3 times in the last month.  Doesn't have side effects.  Gets up to void about once a night, and can get back to sleep.  He sleeps 4-6 hours per night (averaging 5).  Since cutting back eating at night, he wakes up feeling a little tired, but once he gets up and washes his face his energy seems much better.  He used to have the fatigue persist later in the day.  He feels a little draggy--has some energy but "body feels tired".  Hypogonadism:  He used the Axiron for 2 months, didn't really notice much difference in how he felt, so stopped using it 3 weeks ago.  Never had problems with libido or erectile dysfunction.  Energy hadn't improved at all.   He has lost 16 pounds since his last visit.  He hasn't been getting much exercise, but changed his eating habits.  Wife denied any apnea.  Past Medical History  Diagnosis Date  . GERD (gastroesophageal reflux disease)     15 yrs ago, better with dietary changes  . Heart murmur     normal evaluation in past (cardiologist)  . Nocturia   . Low testosterone 2014  . Insomnia   . Vitamin D deficiency    Past Surgical History  Procedure Laterality Date  . Appendectomy  03/2010   History   Social History  . Marital Status: Married    Spouse Name: N/A    Number of Children: 3  . Years of Education: N/A   Occupational History  . 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 Smoking status: Never Smoker   . Smokeless tobacco: Never Used  . Alcohol Use: No  . Drug Use: No  . Sexual Activity: Not on  file   Other Topics Concern  . Not on file   Social History Narrative   Lives with wife, 2/3 children (oldest in college).  2 dogs   Outpatient Encounter Prescriptions as of 03/02/2014  Medication Sig Note  . zolpidem (AMBIEN) 10 MG tablet Take 1 tablet (10 mg total) by mouth at bedtime as needed for sleep.   . Testosterone 30 MG/ACT SOLN 1 pump to each axilla daily 03/02/2014: Stopped it 3 weeks ago because it wasn't helping  . [DISCONTINUED] ergocalciferol (VITAMIN D2) 50000 UNITS capsule Take 1 capsule (50,000 Units total) by mouth once a week.    No Known Allergies  ROS:  Denies URI or allergy symptoms, chest pain, shortness of breath, heartburn.  +low back pain. No radiation of pain, numbness/tingling.  Denies fevers, chills, or other complaints. See HPI  PHYSICAL EXAM: BP 104/74  Pulse 72  Ht 5' 8.25" (1.734 m)  Wt 218 lb (98.884 kg)  BMI 32.89 kg/m2 Well developed, pleasant male in no distress Neck: no lymphadenopathy, thyromegaly or mass Heart: regular rate and rhythm without murmur Lungs: clear bilaterally Back: spine nontender. No CVA tenderness. R lower paraspinous muscles are mildly tender, no significant spasm noted. Extremities: no edema Psych: normal  mood, affect, hygiene and grooming  ASSESSMENT/PLAN:  Unspecified vitamin D deficiency - Plan: Vit D  25 hydroxy (rtn osteoporosis monitoring)  Hypogonadism male - stopped tx due to no improvement in symptoms.  risks of treatment vs hypogonadism reviewed; okay to remain off for now.  Weight loss encouraged  Other malaise and fatigue  Insomnia - improved  Lumbago - muscular (R paraspinous muscles).  heat, massage, NSAIDs prn, stretches; strengthen core  Recheck vitamin D today. Take the last 4 weeks of prescription.  Reminded to start OTC Vitamin D when prescription is completed.  Encouraged daily exercise (cardio and stretches)  Hypogonadism--he lost weight, which should help the numbers some.  He didn't notice  any improvement on the therapy.  Not checking level today, bc has been off therapy for 3 weeks.  Check again in future, when off longer, if energy doesn't continue to improve.   Insomnia--much improved.  Only sporadic ambien use.  He is not getting enough sleep, which might contribute to fatigue.  Encouraged to get at least 7 hours/night.  Low back pain--encourage stretches and core strengthening.   (asked for note for work to wear sneakers instead of boots--not given, as I'm not convinced that his pain is related to his shoewear)  F/u 6 mo for CPE, sooner prn

## 2014-03-03 LAB — VITAMIN D 25 HYDROXY (VIT D DEFICIENCY, FRACTURES): VIT D 25 HYDROXY: 18 ng/mL — AB (ref 30–89)

## 2014-03-09 ENCOUNTER — Encounter: Payer: BC Managed Care – PPO | Admitting: Family Medicine

## 2014-04-06 ENCOUNTER — Telehealth: Payer: Self-pay | Admitting: Internal Medicine

## 2014-04-06 MED ORDER — VITAMIN D (ERGOCALCIFEROL) 1.25 MG (50000 UNIT) PO CAPS: 50000.0000 [IU] | ORAL_CAPSULE | ORAL | Status: DC

## 2014-04-06 NOTE — Telephone Encounter (Signed)
Pt called and states he never got Vitamin D sent to pharmacy . [facility]

## 2014-04-18 ENCOUNTER — Telehealth: Payer: Self-pay | Admitting: Family Medicine

## 2014-04-18 ENCOUNTER — Other Ambulatory Visit: Payer: Self-pay | Admitting: Family Medicine

## 2014-04-18 DIAGNOSIS — G4700 Insomnia, unspecified: Secondary | ICD-10-CM

## 2014-04-18 MED ORDER — ZOLPIDEM TARTRATE 10 MG PO TABS: 10.0000 mg | ORAL_TABLET | Freq: Every evening | ORAL | Status: DC | PRN

## 2014-04-18 NOTE — Telephone Encounter (Signed)
Pt req Ambien Refill to [facility] [address]

## 2014-04-18 NOTE — Telephone Encounter (Signed)
Last filled 11/30/13.  Okay to refill #30, no add'l refill

## 2014-04-18 NOTE — Telephone Encounter (Signed)
Pt called for Ambien refills to [facility] [address].

## 2014-04-18 NOTE — Telephone Encounter (Signed)
I called out Ambien to the pharmacy per Dr. Lynelle DoctorKnapp and I placed the orders in the system. CLS

## 2014-04-21 ENCOUNTER — Other Ambulatory Visit: Payer: Self-pay | Admitting: Family Medicine

## 2014-04-21 NOTE — Telephone Encounter (Signed)
Dr. Lynelle Doctor it looks like this medication was last sent in on 04/18/14. CLS Is this okay to refill or do you want me to refuse this?

## 2014-04-21 NOTE — Telephone Encounter (Signed)
Your documentation from 5/19 states that you called it in.  Just verify same pharmacy, and then can refuse

## 2014-09-07 ENCOUNTER — Ambulatory Visit (INDEPENDENT_AMBULATORY_CARE_PROVIDER_SITE_OTHER): Payer: Managed Care, Other (non HMO) | Admitting: Family Medicine

## 2014-09-07 ENCOUNTER — Encounter: Payer: Self-pay | Admitting: Family Medicine

## 2014-09-07 VITALS — BP 110/80 | HR 64 | Resp 14 | Ht 68.5 in | Wt 220.0 lb

## 2014-09-07 DIAGNOSIS — Z0000 Encounter for general adult medical examination without abnormal findings: Secondary | ICD-10-CM

## 2014-09-07 DIAGNOSIS — J189 Pneumonia, unspecified organism: Secondary | ICD-10-CM

## 2014-09-07 DIAGNOSIS — G4700 Insomnia, unspecified: Secondary | ICD-10-CM

## 2014-09-07 DIAGNOSIS — E291 Testicular hypofunction: Secondary | ICD-10-CM

## 2014-09-07 DIAGNOSIS — E559 Vitamin D deficiency, unspecified: Secondary | ICD-10-CM

## 2014-09-07 DIAGNOSIS — R351 Nocturia: Secondary | ICD-10-CM

## 2014-09-07 DIAGNOSIS — R5383 Other fatigue: Secondary | ICD-10-CM

## 2014-09-07 LAB — LIPID PANEL
Cholesterol: 140 mg/dL (ref 0–200)
HDL: 47 mg/dL (ref >39)
LDL CALC: 77 mg/dL (ref 0–99)
Total CHOL/HDL Ratio: 3 Ratio
Triglycerides: 78 mg/dL (ref <150)
VLDL: 16 mg/dL (ref 0–40)

## 2014-09-07 LAB — POCT URINALYSIS DIPSTICK
Bilirubin, UA: NEGATIVE
Glucose, UA: NEGATIVE
Ketones, UA: NEGATIVE
Leukocytes, UA: NEGATIVE
NITRITE UA: NEGATIVE
PH UA: 6
PROTEIN UA: NEGATIVE
RBC UA: NEGATIVE
SPEC GRAV UA: 1.015
UROBILINOGEN UA: NEGATIVE

## 2014-09-07 LAB — COMPREHENSIVE METABOLIC PANEL
ALT: 17 U/L (ref 0–53)
AST: 17 U/L (ref 0–37)
Albumin: 4.1 g/dL (ref 3.5–5.2)
Alkaline Phosphatase: 63 U/L (ref 39–117)
BILIRUBIN TOTAL: 0.4 mg/dL (ref 0.2–1.2)
BUN: 12 mg/dL (ref 6–23)
CALCIUM: 8.9 mg/dL (ref 8.4–10.5)
CHLORIDE: 103 meq/L (ref 96–112)
CO2: 27 meq/L (ref 19–32)
CREATININE: 0.99 mg/dL (ref 0.50–1.35)
Glucose, Bld: 85 mg/dL (ref 70–99)
Potassium: 4.5 mEq/L (ref 3.5–5.3)
SODIUM: 139 meq/L (ref 135–145)
TOTAL PROTEIN: 7.2 g/dL (ref 6.0–8.3)

## 2014-09-07 LAB — TSH: TSH: 1.139 u[IU]/mL (ref 0.350–4.500)

## 2014-09-07 NOTE — Progress Notes (Signed)
Chief Complaint  Patient presents with  . Annual Exam    Patient is here for cpe he has his vision checked every two years .He has denied the flu shot because he will get one at work in 2 weeks     James Townsend is a 43 y.o. male who presents for a complete physical.  He has the following concerns:  He went to [facility] 10/5 with fever, chills, cough, muscle aches.  CXR showed R basilar atelectasis vs airspace disease.  There was a nodular density noted in R lower lung zone, felt to be an extension of underlying atelectasis vs airspace disease.  CXR  vs CT recommended in 1 week for recheck.  He was prescribed levaquin and tessalon.  Fevers and myalgias have resolved, but he has persistent cough. He coughs more when he lies down.  Cough had been nonproductive, but just started getting up some "dark stuff".  He took Nyquil for the last 2 nights.  He is having some throat discomfort for the last 3 weeks--feeling like it wants to close up.  Denies postnasal drainage, tongue swelling, denies allergies.  Vitamin D deficiency: Last Vitamin D-OH was 63 in April.  He was treated with another 12 weeks of prescription vitamin D, and has been taking 2000 IU since then.  Insomnia: As long as he doesn't eat/drink for 3 hours before going to bed, he is able to sleep better.  He still intermittently has frequent urination at night which can interfere with his sleep (about once a week). Usually he just gets up once at night, and can fall back asleep.   He hasn't needed to take Ambien in a long time. For the last month he has been feeling fatigued.  Prior to that he had been exercising daily, but just hasn't had the energy to do it for the last month.  Hypogonadism: He used the Axiron for 2 months, didn't really notice much difference in how he felt, so he stopped using it. Never had problems with libido or erectile dysfunction. Energy hadn't improved at all.    There is no immunization history on file for this  patient. He states he had a tetanus shot within 2 years--will try and find out exact date (and type) Will get flu shot at work in 2 weeks Last colonoscopy: once in his 20's (normal) Last PSA: 12/2013 (0.62) Dentist: every 6 months Ophtho: every 2 years, went last year Exercise:  He had been exercising daily, but none over the last month "too tired".  Past Medical History  Diagnosis Date  . GERD (gastroesophageal reflux disease)     15 yrs ago, better with dietary changes  . Heart murmur     normal evaluation in past (cardiologist)  . Nocturia   . Low testosterone 2014  . Insomnia   . Vitamin D deficiency     Past Surgical History  Procedure Laterality Date  . Appendectomy  03/2010    History   Social History  . Marital Status: Married    Spouse Name: N/A    Number of Children: 3  . 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 Smoking status: Never Smoker   . Smokeless tobacco: Never Used  . Alcohol Use: No  . Drug Use: No  . Sexual Activity: Yes    Partners: Female   Other Topics Concern  . Not on file   Social  History Narrative   Lives with wife, 1/3 children (oldest in college, 1 in the [employer]).  2 dogs    Family History  Problem Relation Age of Onset  . Cancer Mother     breast cancer in 58's  . Breast cancer Mother     7's  . Hypertension Maternal Grandmother   . Diabetes Neg Hx   . Stroke Neg Hx   . Heart disease Daughter     "hole in heart", congenital, resolved on its own  . Hypertension Father     Outpatient Encounter Prescriptions as of 09/07/2014  Medication Sig Note  . benzonatate (TESSALON) 200 MG capsule Take 200 mg by mouth 3 (three) times daily as needed for cough.   . cholecalciferol (VITAMIN D) 1000 UNITS tablet Take 2,000 Units by mouth daily.   Marland Kitchen levofloxacin (LEVAQUIN) 500 MG tablet Take 500 mg by mouth daily.   Marland Kitchen zolpidem (AMBIEN) 10 MG tablet Take 1 tablet (10  mg total) by mouth at bedtime as needed for sleep. 09/07/2014: No longer needing  . [DISCONTINUED] Testosterone 30 MG/ACT SOLN 1 pump to each axilla daily 03/02/2014: Stopped it 3 weeks ago because it wasn't helping  . [DISCONTINUED] Vitamin D, Ergocalciferol, (DRISDOL) 50000 UNITS CAPS capsule Take 1 capsule (50,000 Units total) by mouth every 7 (seven) days.     No Known Allergies  ROS:  The patient denies anorexia, weight changes, headaches, vision loss, decreased hearing, ear pain, hoarseness, chest pain, palpitations, dizziness, syncope, dyspnea on exertion, swelling, nausea, vomiting, diarrhea, constipation, abdominal pain, melena, hematochezia, indigestion/heartburn, hematuria, incontinence, erectile dysfunction, weakened urine stream, dysuria, genital lesions, joint pains, numbness, tingling, weakness, tremor, suspicious skin lesions, depression, anxiety, abnormal bleeding/bruising, or enlarged lymph nodes. He regained just 2 pounds over the last 6 months (kept most of weight off that he lost). Intermittent nocturia (weekly--see HPI). Cough/febrile illness--see HPI.  PHYSICAL EXAM: BP 110/80  Pulse 64  Resp 14  Ht 5' 8.5" (1.74 m)  Wt 220 lb (99.791 kg)  BMI 32.96 kg/m2  General Appearance:    Alert, cooperative, no distress, appears stated age. Occasional dry cough.  Speaking easily in full sentences, in no distress  Head:    Normocephalic, without obvious abnormality, atraumatic  Eyes:    PERRL, conjunctiva/corneas clear, EOM's intact, fundi    benign  Ears:    Normal TM's and external ear canals  Nose:   Nares normal, mucosa mildly edematous, no drainage or sinus tenderness  Throat:   Lips, mucosa, and tongue normal; teeth and gums normal  Neck:   Supple, no lymphadenopathy;  thyroid:  no   enlargement/tenderness/nodules; no carotid   bruit or JVD  Back:    Spine nontender, no curvature, ROM normal, no CVA     tenderness  Lungs:     Slight crackles noted at right posterior lung  field.  Good air movement, without wheezes or ronchi; respirations unlabored  Chest Wall:    No tenderness or deformity   Heart:    Regular rate and rhythm, S1 and S2 normal, no murmur, rub   or gallop  Breast Exam:    No chest wall tenderness, masses or gynecomastia  Abdomen:     Soft, non-tender, nondistended, normoactive bowel sounds,    no masses, no hepatosplenomegaly  Genitalia:    Normal male external genitalia without lesions.  Testicles without masses.  No inguinal hernias.  Rectal:    Normal sphincter tone, no masses or tenderness; guaiac negative stool.  Prostate smooth,  no nodules, mildly enlarged.  Extremities:   No clubbing, cyanosis or edema  Pulses:   2+ and symmetric all extremities  Skin:   Skin color, texture, turgor normal, no rashes or lesions  Lymph nodes:   Cervical, supraclavicular, and axillary nodes normal  Neurologic:   CNII-XII intact, normal strength, sensation and gait; reflexes 2+ and symmetric throughout          Psych:   Normal mood, affect, hygiene and grooming.     ASSESSMENT/PLAN:  Annual physical exam - Plan: Lipid panel, Comprehensive metabolic panel, CBC with Differential, TSH  Vitamin D deficiency - Plan: Visual acuity screening, POCT Urinalysis Dipstick, Vit D  25 hydroxy (rtn osteoporosis monitoring)  Hypogonadism male - recheck. if no other cause for fatigue found, is willing to retry testosterone replacement therapy - Plan: Testosterone  Insomnia - resolved/improved with modifications in behavior (avoiding late night eating)  Other fatigue - Plan: Comprehensive metabolic panel, CBC with Differential, TSH  Nocturia - sporadic. avoid caffeine. likely has component of mild BPH.  can consider tryin saw palmetto  Pneumonia, organism unspecified - complete levaquin rx and f/u next week for CXR. May need CT if worsening; understands will likely need add'l f/u in 2-4 weeks (unless repeat is completely nl)   Vit D, c-met, lipid, testost,  TSH  Cough/pneumonia/abnormal chest x-ray.  Continue the course of levaquin, and follow up next week with repeat CXR as recommended.  Use Mucinex as needed to loosen up the phlegm.  You may continue to use dextromethorphan as a cough suppressant (this is in Nyquil, Robitussin, Delsym syrup and Mucinex--make sure that you aren't finding this same ingredient in multiple OTC meds that you are taking--ie don't get Nyquil and Mucinex-DM as they both have same ingredient).  Fatigue-- Pt is willing to retry testosterone replacement (Axiron) if no other cause found.  Explained that we would need to recheck a level (prior to stopping) to see if dose needs to be increased. Exercise  Mild BPH--consider trial of saw palmetto.    Discussed PSA screening (risks/benefits), recommended at least 30 minutes of aerobic activity at least 5 days/week; proper sunscreen use reviewed; healthy diet and alcohol recommendations (less than or equal to 2 drinks/day) reviewed; regular seatbelt use; changing batteries in smoke detectors. Self-testicular exams. Immunization recommendations discussed.  Colonoscopy recommendations reviewed.  F/u based on labs (ie soon if restarting testosterone).  CPE 1 year Repeat CXR as planned on Monday (at [facility], so comparison is available)

## 2014-09-07 NOTE — Patient Instructions (Addendum)
  HEALTH MAINTENANCE RECOMMENDATIONS:  It is recommended that you get at least 30 minutes of aerobic exercise at least 5 days/week (for weight loss, you may need as much as 60-90 minutes). This can be any activity that gets your heart rate up. This can be divided in 10-15 minute intervals if needed, but try and build up your endurance at least once a week.  Weight bearing exercise is also recommended twice weekly.  Eat a healthy diet with lots of vegetables, fruits and fiber.  "Colorful" foods have a lot of vitamins (ie green vegetables, tomatoes, red peppers, etc).  Limit sweet tea, regular sodas and alcoholic beverages, all of which has a lot of calories and sugar.  Up to 2 alcoholic drinks daily may be beneficial for men (unless trying to lose weight, watch sugars).  Drink a lot of water.  Sunscreen of at least SPF 30 should be used on all sun-exposed parts of the skin when outside between the hours of 10 am and 4 pm (not just when at beach or pool, but even with exercise, golf, tennis, and yard work!)  Use a sunscreen that says "broad spectrum" so it covers both UVA and UVB rays, and make sure to reapply every 1-2 hours.  Remember to change the batteries in your smoke detectors when changing your clock times in the spring and fall.  Use your seat belt every time you are in a car, and please drive safely and not be distracted with cell phones and texting while driving.  Continue the course of levaquin, and follow up next week with repeat CXR as recommended.  Use Mucinex as needed to loosen up the phlegm.  You may continue to use dextromethorphan as a cough suppressant (this is in Nyquil, Robitussin, Delsym syrup and Mucinex--make sure that you aren't finding this same ingredient in multiple OTC meds that you are taking--ie don't get Nyquil and Mucinex-DM as they both have same ingredient).  Please check to see when your last tetanus shot was.  Call us with the date (and the type--Td vs TdaP) so we  can enter in our records.  If you are unable to find this information, we may need to give you another one.  Consider trying saw palmetto (for urinary frequency at night, likely related to your prostate being a little enlarged).  Urinary frequency might also be related to caffeine or certain OTC medications, so try and pay attention to any relation to these things which can be avoided.

## 2014-09-08 LAB — CBC WITH DIFFERENTIAL/PLATELET
BASOS ABS: 0 10*3/uL (ref 0.0–0.1)
BASOS PCT: 1 % (ref 0–1)
EOS PCT: 6 % — AB (ref 0–5)
Eosinophils Absolute: 0.2 10*3/uL (ref 0.0–0.7)
HEMATOCRIT: 38.7 % — AB (ref 39.0–52.0)
Hemoglobin: 12.7 g/dL — ABNORMAL LOW (ref 13.0–17.0)
LYMPHS PCT: 49 % — AB (ref 12–46)
Lymphs Abs: 1.6 10*3/uL (ref 0.7–4.0)
MCH: 26.8 pg (ref 26.0–34.0)
MCHC: 32.8 g/dL (ref 30.0–36.0)
MCV: 81.6 fL (ref 78.0–100.0)
MONO ABS: 0.5 10*3/uL (ref 0.1–1.0)
Monocytes Relative: 15 % — ABNORMAL HIGH (ref 3–12)
Neutro Abs: 0.9 10*3/uL — ABNORMAL LOW (ref 1.7–7.7)
Neutrophils Relative %: 29 % — ABNORMAL LOW (ref 43–77)
PLATELETS: 185 10*3/uL (ref 150–400)
RBC: 4.74 MIL/uL (ref 4.22–5.81)
RDW: 15 % (ref 11.5–15.5)
WBC: 3.2 10*3/uL — AB (ref 4.0–10.5)

## 2014-09-08 LAB — VITAMIN D 25 HYDROXY (VIT D DEFICIENCY, FRACTURES): Vit D, 25-Hydroxy: 19 ng/mL — ABNORMAL LOW (ref 30–89)

## 2014-09-08 LAB — PATHOLOGIST SMEAR REVIEW

## 2014-09-08 LAB — TESTOSTERONE: Testosterone: 235 ng/dL — ABNORMAL LOW (ref 300–890)

## 2014-09-11 ENCOUNTER — Other Ambulatory Visit: Payer: Self-pay | Admitting: *Deleted

## 2014-09-11 DIAGNOSIS — E559 Vitamin D deficiency, unspecified: Secondary | ICD-10-CM

## 2014-09-11 DIAGNOSIS — E291 Testicular hypofunction: Secondary | ICD-10-CM

## 2014-09-11 MED ORDER — TESTOSTERONE 30 MG/ACT TD SOLN: 1.0000 | Freq: Every day | TRANSDERMAL | Status: DC

## 2014-09-11 MED ORDER — ERGOCALCIFEROL 1.25 MG (50000 UT) PO CAPS: 50000.0000 [IU] | ORAL_CAPSULE | ORAL | Status: DC

## 2014-09-28 ENCOUNTER — Ambulatory Visit (INDEPENDENT_AMBULATORY_CARE_PROVIDER_SITE_OTHER): Payer: Managed Care, Other (non HMO) | Admitting: Family Medicine

## 2014-09-28 ENCOUNTER — Ambulatory Visit
Admission: RE | Admit: 2014-09-28 | Discharge: 2014-09-28 | Disposition: A | Discharge status: Home / Self Care | Payer: Managed Care, Other (non HMO) | Source: Ambulatory Visit | Attending: Family Medicine | Admitting: Family Medicine

## 2014-09-28 ENCOUNTER — Encounter: Payer: Self-pay | Admitting: Family Medicine

## 2014-09-28 VITALS — BP 120/70 | HR 80 | Temp 98.3°F | Ht 68.5 in | Wt 226.0 lb

## 2014-09-28 DIAGNOSIS — R059 Cough, unspecified: Secondary | ICD-10-CM

## 2014-09-28 DIAGNOSIS — R05 Cough: Secondary | ICD-10-CM

## 2014-09-28 DIAGNOSIS — J189 Pneumonia, unspecified organism: Secondary | ICD-10-CM

## 2014-09-28 MED ORDER — METHYLPREDNISOLONE (PAK) 4 MG PO TABS: ORAL_TABLET | ORAL | Status: DC

## 2014-09-28 NOTE — Patient Instructions (Signed)
Drink plenty of water. Re-try the benzonatate for the dry cough. Add back in some Mucinex/guaifenesin (especially if you are not taking the new prescription cough syrup which contains guaifenesin--don't use mucinex along with the syrup). Go to [facility] for chest x-ray today/tomorrow ([address])  Start taking the steroid prescription today. Consider taking zantac or prilosec.  It is possible that reflux might be contributing to your cough, since it is worse after eating/drinking.  Return if having ongoing cough, shortness of breath, fevers, weight loss, or other concerns.

## 2014-09-28 NOTE — Progress Notes (Signed)
Chief Complaint  Patient presents with  . Cough    was diagnosed with pneumonia last month-still has nagging cough. Feels like he is coughing worse after he eats and drinks. Feels like he "still has a little cold" in there.    He went to [facility] 10/5 with fever, chills, cough, muscle aches. CXR showed R basilar atelectasis vs airspace disease. There was a nodular density noted in R lower lung zone, felt to be an extension of underlying atelectasis vs airspace disease.  He was prescribed levaquin and tessalon.  CXR vs CT recommended in 1 week for recheck.  He went back [facility] the repeat CX[facility], and was told that it was improving (but not completely resolved).  He was then prescribed guaifenesin with codeine, as the tessalon wasn't very helpful.  He doesn't find the cough syrup to be all that helpful--not like previously prescribed syrups.  He thinks the Delsym might work just as well, or better than the the prescribed syrup.  Denies any fevers. He continues to have a cough--he feels some phlegm in his throat. He sometimes coughs it up, but isn't sure of the color (dark, not bloody, ??yellow--wasn't sure).  Cough is mostly nonproductive.  Cough is worse during the day, and worse when lying flat.  He seems to cough more with activity, and also after eating/drinking.  Denies shortness of breath. Denies any heartburn, belching or reflux.  He denies any sniffles, sneezing, postnasal drainage, sore throat, allergies.  PMH, PSH, SH reviewed.  Outpatient Encounter Prescriptions as of 09/28/2014  Medication Sig Note  . ergocalciferol (VITAMIN D2) 50000 UNITS capsule Take 1 capsule (50,000 Units total) by mouth once a week.   . Testosterone (AXIRON) 30 MG/ACT SOLN Place 1 Squirt onto the skin daily.   Marland Kitchen. CHERATUSSIN AC 100-10 MG/5ML syrup Take 5 mLs by mouth 3 (three) times daily as needed for cough.  09/28/2014: From [facility]; hasn't taken in 2 days  . cholecalciferol (VITAMIN D) 1000 UNITS tablet Take  2,000 Units by mouth daily. 09/28/2014: Taking prescription currently, not OTC  . [DISCONTINUED] benzonatate (TESSALON) 200 MG capsule Take 200 mg by mouth 3 (three) times daily as needed for cough.   . [DISCONTINUED] levofloxacin (LEVAQUIN) 500 MG tablet Take 500 mg by mouth daily.   . [DISCONTINUED] zolpidem (AMBIEN) 10 MG tablet Take 1 tablet (10 mg total) by mouth at bedtime as needed for sleep. 09/07/2014: No longer needing   He hasn't used the Cheratussin in 2 days.  He had been taking Delsym, but he ran out.  No Known Allergies  ROS:  Denies fevers, chills.  Slight nausea yesterday, otherwise none. No vomiting, diarrhea, urinary complaints.  Denies bleeding, bruising, rashes.  He has had some constipation, for which he has been using Miralax.  PHYSICAL EXAM:  BP 120/70  Pulse 80  Temp(Src) 98.3 F (36.8 C) (Tympanic)  Ht 5' 8.5" (1.74 m)  Wt 226 lb (102.513 kg)  BMI 33.86 kg/m2  Well developed, pleasant male, with frequent dry cough.  Speaking easily in full sentences.  In no distress HEENT:  PERRL, EOMI, conjunctiva clear.  TM's and EAC's normal.  Nasal mucosa is normal.  Sinuses nontender. OP is clear Neck: no lymphadenopathy, thyromegaly Heart: regular rate and rhythm without murmur Lungs: clear bilaterally with good air movement.  No cough with deep breath or forced expiration.  No wheezes, rales, ronchi Extremities: no edema Neuro: alert and oriented.  Cranial nerves, strength and gait are normal  ASSESSMENT/PLAN:  Pneumonia, organism unspecified - due for recheck CXR to ensure complete clearance.  He has persistent cough without e/o wheezing, PND or reflux.  Treat with course of steroids.   - Plan: DG Chest 2 View  Cough - continue mucinex; tessalon prn and/or delsym as needed - Plan: DG Chest 2 View, methylPREDNIsolone (MEDROL DOSPACK) 4 MG tablet  Risks/side effects of steroids reviewed in detail.   Drink plenty of water. Re-try the benzonatate for the dry  cough. Add back in some Mucinex/guaifenesin (especially if you are not taking the new prescription cough syrup which contains guaifenesin--don't use mucinex along with the syrup). [facility] for chest x-ray today/tomorrow  Start taking the steroid prescription today. Consider taking zantac or prilosec.  It is possible that reflux might be contributing to your cough, since it is worse after eating/drinking.  Return if having ongoing cough, shortness of breath, fevers, weight loss, or other concerns.

## 2014-11-27 ENCOUNTER — Other Ambulatory Visit: Payer: Self-pay | Admitting: Family Medicine

## 2014-11-27 NOTE — Telephone Encounter (Signed)
Pt will finish up his last 50,000 unit of Vitamin D this week and then start one OTC 2,000 units and come in for his lab test.

## 2014-11-27 NOTE — Telephone Encounter (Signed)
Pt has an appt on 12/14/14 to get vitamin D checked. Is this okay to refill til then or wait?

## 2014-11-27 NOTE — Telephone Encounter (Signed)
Refused rx.

## 2014-11-27 NOTE — Telephone Encounter (Signed)
Do not refill.  Please call pt and make sure that he is taking 2000 IU of OTC vitamin D every day until his lab test (and if test is okay, he will be maintained on this OTC dose long-term)

## 2014-12-14 ENCOUNTER — Telehealth: Payer: Self-pay | Admitting: Family Medicine

## 2014-12-14 ENCOUNTER — Other Ambulatory Visit: Payer: Managed Care, Other (non HMO)

## 2014-12-14 DIAGNOSIS — E291 Testicular hypofunction: Secondary | ICD-10-CM

## 2014-12-14 DIAGNOSIS — E559 Vitamin D deficiency, unspecified: Secondary | ICD-10-CM

## 2014-12-14 LAB — TESTOSTERONE: Testosterone: 212 ng/dL — ABNORMAL LOW (ref 300–890)

## 2014-12-15 LAB — VITAMIN D 25 HYDROXY (VIT D DEFICIENCY, FRACTURES): Vit D, 25-Hydroxy: 18 ng/mL — ABNORMAL LOW (ref 30–100)

## 2014-12-15 LAB — PSA: PSA: 0.57 ng/mL (ref <=4.00)

## 2014-12-18 ENCOUNTER — Other Ambulatory Visit: Payer: Self-pay | Admitting: *Deleted

## 2014-12-19 NOTE — Telephone Encounter (Signed)
Thanks

## 2014-12-19 NOTE — Telephone Encounter (Signed)
[facility]

## 2014-12-19 NOTE — Telephone Encounter (Signed)
P.A. AXIRON denied, pt must have trial of Androgel and Testosterone gell.  Do you want to switch to either?

## 2014-12-19 NOTE — Telephone Encounter (Signed)
He needs to schedule office visit to f/u on recent abnormal labs.  Please schedule, and we can discuss and change at his visit.

## 2014-12-28 ENCOUNTER — Ambulatory Visit (INDEPENDENT_AMBULATORY_CARE_PROVIDER_SITE_OTHER): Payer: Managed Care, Other (non HMO) | Admitting: Family Medicine

## 2014-12-28 ENCOUNTER — Encounter: Payer: Self-pay | Admitting: Family Medicine

## 2014-12-28 VITALS — BP 122/74 | HR 72 | Ht 68.5 in | Wt 235.0 lb

## 2014-12-28 DIAGNOSIS — E291 Testicular hypofunction: Secondary | ICD-10-CM

## 2014-12-28 DIAGNOSIS — E559 Vitamin D deficiency, unspecified: Secondary | ICD-10-CM

## 2014-12-28 MED ORDER — TESTOSTERONE 20.25 MG/1.25GM (1.62%) TD GEL
TRANSDERMAL | Status: DC
Start: 2014-12-28 — End: 2015-04-09

## 2014-12-28 MED ORDER — ERGOCALCIFEROL 1.25 MG (50000 UT) PO CAPS: 50000.0000 [IU] | ORAL_CAPSULE | ORAL | Status: DC

## 2014-12-28 NOTE — Patient Instructions (Signed)
  Finish up the Axiron you have left by increasing to 2 pumps each day (1 pump in each armpit).  Once you run out, switch to the Androgel (due to formulary/insurance reasons). Use two pumps daily of the Androgel.  Restart another prescription of vitamin D, taken weekly for another 3 months.   We will recheck testosterone level and vitamin D level in 3 months, to determine if additional prescription Vitamin D is needed, and if dose increase of the testosterone is needed

## 2014-12-28 NOTE — Progress Notes (Signed)
Chief Complaint  Patient presents with  . Follow-up    go over lab results.    Vitamin D deficiency:  Last check (1/14) was 18. This is after having taken prescription x 3 months, followed by 2000 IU of OTC Vitamin D daily.  Hypogonadism.  He is using Axiron--using just one pump daily.  He started using the prior prescription first, but still hadn't expired.  He is now on the new prescription. Last testosterone (12/14/14) was low at 212.  This is lower than his October level, which was prior to starting the Axiron (was 235 then). His energy has not improved at all.  Axiron is no longer covered by insurance, needs to change (see phone encounter re: denied prior [place]).  S/p pneumonia in September.  Last seen in October with nagging cough.  Steroids helped, and no further cough.  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  PMH, PSH, SH reviewed.  Meds: Axiron, 1 pump daily, vitamin D 2000 IU daily. No Known Allergies  ROS: +fatigue.  Denies fevers, chills, headaches (had some for 2 days recently, but resolved), dizziness, chest pain, swelling, shortness of breath.  Cough completely resolved.  No bleeding, bruising, rashes. No snoring, apnea.  PHYSICAL EXAM: BP 140/88 mmHg  Pulse 72  Ht 5' 8.5" (1.74 m)  Wt 235 lb (106.595 kg)  BMI 35.21 kg/m2 122/74 on repeat by MD Well developed, pleasant, overweight male in no distress Neck: no lymphadenopathy, thyromegaly or mass Heart: regular rate and rhythm without murmur Lungs: clear bilaterally Extremities: no edema Psych: normal mood, affect, hygiene and grooming  ASSESSMENT/PLAN:  Vitamin D deficiency - Plan: ergocalciferol (VITAMIN D2) 50000 UNITS capsule  Hypogonadism male - Plan: Testosterone (ANDROGEL) 20.25 MG/1.25GM (1.62%) GEL   Finish up the Axiron you have left by increasing to 2 pumps each day (1 pump in each armpit).  Once you run out, switch to the Androgel (due to formulary/insurance reasons). Use two pumps daily of the  Androgel.  Restart another prescription of vitamin D, taken weekly for another 3 months.   We will recheck testosterone level and vitamin D level in 3 months, to determine if additional prescription Vitamin D is needed, and if dose increase of the testosterone is needed  Lab visit 3 months, future orders entered.

## 2014-12-28 NOTE — Telephone Encounter (Signed)
Medication switched at office visit

## 2015-04-05 ENCOUNTER — Ambulatory Visit (INDEPENDENT_AMBULATORY_CARE_PROVIDER_SITE_OTHER): Payer: Managed Care, Other (non HMO) | Admitting: Family Medicine

## 2015-04-05 DIAGNOSIS — E559 Vitamin D deficiency, unspecified: Secondary | ICD-10-CM

## 2015-04-05 DIAGNOSIS — E291 Testicular hypofunction: Secondary | ICD-10-CM

## 2015-04-06 LAB — TESTOSTERONE: Testosterone: 227 ng/dL — ABNORMAL LOW (ref 300–890)

## 2015-04-06 LAB — VITAMIN D 25 HYDROXY (VIT D DEFICIENCY, FRACTURES): VIT D 25 HYDROXY: 21 ng/mL — AB (ref 30–100)

## 2015-04-09 ENCOUNTER — Other Ambulatory Visit: Payer: Self-pay | Admitting: *Deleted

## 2015-04-09 DIAGNOSIS — E559 Vitamin D deficiency, unspecified: Secondary | ICD-10-CM

## 2015-04-09 DIAGNOSIS — R7989 Other specified abnormal findings of blood chemistry: Secondary | ICD-10-CM

## 2015-04-09 MED ORDER — TESTOSTERONE 20.25 MG/1.25GM (1.62%) TD GEL: TRANSDERMAL | Status: DC

## 2015-04-09 MED ORDER — ERGOCALCIFEROL 1.25 MG (50000 UT) PO CAPS: 50000.0000 [IU] | ORAL_CAPSULE | ORAL | Status: DC

## 2015-04-12 NOTE — Progress Notes (Signed)
Changed from OV to lab visit

## 2015-07-02 ENCOUNTER — Other Ambulatory Visit: Payer: Self-pay | Admitting: Family Medicine

## 2015-07-09 ENCOUNTER — Other Ambulatory Visit: Payer: Self-pay

## 2015-07-11 ENCOUNTER — Other Ambulatory Visit: Payer: Managed Care, Other (non HMO)

## 2015-07-11 DIAGNOSIS — R7989 Other specified abnormal findings of blood chemistry: Secondary | ICD-10-CM

## 2015-07-11 DIAGNOSIS — E559 Vitamin D deficiency, unspecified: Secondary | ICD-10-CM

## 2015-07-12 ENCOUNTER — Ambulatory Visit (INDEPENDENT_AMBULATORY_CARE_PROVIDER_SITE_OTHER): Payer: Managed Care, Other (non HMO) | Admitting: Family Medicine

## 2015-07-12 ENCOUNTER — Encounter: Payer: Self-pay | Admitting: Family Medicine

## 2015-07-12 VITALS — BP 128/78 | HR 80 | Ht 68.5 in | Wt 239.4 lb

## 2015-07-12 DIAGNOSIS — E291 Testicular hypofunction: Secondary | ICD-10-CM | POA: Diagnosis not present

## 2015-07-12 DIAGNOSIS — E559 Vitamin D deficiency, unspecified: Secondary | ICD-10-CM

## 2015-07-12 LAB — CBC WITH DIFFERENTIAL/PLATELET
Basophils Absolute: 0 10*3/uL (ref 0.0–0.1)
Basophils Relative: 0 % (ref 0–1)
Eosinophils Absolute: 0.1 10*3/uL (ref 0.0–0.7)
Eosinophils Relative: 4 % (ref 0–5)
HCT: 40.8 % (ref 39.0–52.0)
HEMOGLOBIN: 13.1 g/dL (ref 13.0–17.0)
Lymphocytes Relative: 41 % (ref 12–46)
Lymphs Abs: 1.4 10*3/uL (ref 0.7–4.0)
MCH: 26.4 pg (ref 26.0–34.0)
MCHC: 32.1 g/dL (ref 30.0–36.0)
MCV: 82.3 fL (ref 78.0–100.0)
MPV: 12.2 fL (ref 8.6–12.4)
Monocytes Absolute: 0.4 10*3/uL (ref 0.1–1.0)
Monocytes Relative: 12 % (ref 3–12)
Neutro Abs: 1.5 10*3/uL — ABNORMAL LOW (ref 1.7–7.7)
Neutrophils Relative %: 43 % (ref 43–77)
Platelets: 137 10*3/uL — ABNORMAL LOW (ref 150–400)
RBC: 4.96 MIL/uL (ref 4.22–5.81)
RDW: 15.3 % (ref 11.5–15.5)
WBC: 3.4 10*3/uL — ABNORMAL LOW (ref 4.0–10.5)

## 2015-07-12 LAB — VITAMIN D 25 HYDROXY (VIT D DEFICIENCY, FRACTURES): VIT D 25 HYDROXY: 26 ng/mL — AB (ref 30–100)

## 2015-07-12 LAB — TESTOSTERONE: Testosterone: 145 ng/dL — ABNORMAL LOW (ref 300–890)

## 2015-07-12 LAB — PSA: PSA: 0.6 ng/mL (ref <=4.00)

## 2015-07-12 MED ORDER — TESTOSTERONE CYPIONATE 200 MG/ML IM SOLN: INTRAMUSCULAR | Status: DC

## 2015-07-12 MED ORDER — ERGOCALCIFEROL 1.25 MG (50000 UT) PO CAPS: 50000.0000 [IU] | ORAL_CAPSULE | ORAL | Status: DC

## 2015-07-12 NOTE — Progress Notes (Signed)
Chief Complaint  Patient presents with  . Med check    nonfasting, had labs done already. States he is using 4 pumps of his testosterone. (2 pumps in the am, and 2 pumps at night)   He reports compliance with using 4 pumps of androgel daily (2 pumps in the morning, and 2 pumps in the evening).  He was on Axiron prior to Androgel, changed for insurance reasons.  Results do not show any response to treatment (see below).  Denies rash or side effects  Vitamin D deficiency--he just took the last rx capsule a few days ago, hasn't started OTC yet.  He gets some sun exposure.  PMH, PSH, SH reviewed.  Outpatient Encounter Prescriptions as of 07/12/2015  Medication Sig Note  . Testosterone (ANDROGEL) 20.25 MG/1.25GM (1.62%) GEL Apply 4 pumps to skin each morning 07/12/2015: Using 2 pumps BID  . cholecalciferol (VITAMIN D) 1000 UNITS tablet Take 2,000 Units by mouth daily. 07/12/2015: Took last prescription pill this past weekend; hasn't started OTC yet  . [DISCONTINUED] ANDROGEL PUMP 20.25 MG/ACT (1.62%) GEL USE 4 PUMPS DAILY 07/12/2015: Received from: External Pharmacy  . [DISCONTINUED] ergocalciferol (VITAMIN D2) 50000 UNITS capsule Take 1 capsule (50,000 Units total) by mouth once a week.    No facility-administered encounter medications on file as of 07/12/2015.   No Known Allergies  ROS: no fever, chills, headaches, dizziness, chest pain, cough, shortness of breath, nausea, vomiting, bowel changes or other concerns. Gained 21# in the last 14 months.  Current exercise is push-ups and squats. No hair/skin/bowel changes  PHYSICAL EXAM: BP 128/78 mmHg  Pulse 80  Ht 5' 8.5" (1.74 m)  Wt 239 lb 6.4 oz (108.591 kg)  BMI 35.87 kg/m2 Well developed, pleasant male in no distress Visit limited to discussion of results, counseling re: med risks/side effects, alternatives   VItamin D-OH 26 (up from 21 in 04/2015) Testosterone 145 (was 227 in 04/2015)  Lab Results  Component Value Date   WBC 3.4*  07/11/2015   HGB 13.1 07/11/2015   HCT 40.8 07/11/2015   MCV 82.3 07/11/2015   PLT 137* 07/11/2015   Lab Results  Component Value Date   PSA 0.60 07/11/2015   PSA 0.57 12/14/2014   PSA 0.62 12/02/2013      ASSESSMENT/PLAN:  Hypogonadism male - not responding to topical testosterone (tried 2, due to insurance reasons); start injections.  q2 wks x 2, then qmonth.  risks/side effects reviewed. - Plan: testosterone cypionate (DEPOTESTOSTERONE CYPIONATE) 200 MG/ML injection, Testosterone  Vitamin D deficiency - slowly responding.  treat for an additional 12 weeks with rx, then 2000 IU of OTC Vitamin D3 longterm - Plan: ergocalciferol (VITAMIN D2) 50000 UNITS capsule  Return for NV with medication.   q2 weeks x 2, then monthly.  Recheck testosterone 2 months after starting

## 2015-07-12 NOTE — Patient Instructions (Signed)
Testosterone injection What is this medicine? TESTOSTERONE (tes TOS ter one) is the main male hormone. It supports normal male development such as muscle growth, facial hair, and deep voice. It is used in males to treat low testosterone levels. This medicine may be used for other purposes; ask your health care provider or pharmacist if you have questions. COMMON BRAND NAME(S): Andro-L.A., Aveed, Delatestryl, Depo-Testosterone, Virilon What should I tell my health care provider before I take this medicine? They need to know if you have any of these conditions: -breast cancer -diabetes -heart disease -kidney disease -liver disease -lung disease -prostate cancer, enlargement -an unusual or allergic reaction to testosterone, other medicines, foods, dyes, or preservatives -pregnant or trying to get pregnant -breast-feeding How should I use this medicine? This medicine is for injection into a muscle. It is usually given by a health care professional in a hospital or clinic setting. Contact your pediatrician regarding the use of this medicine in children. While this medicine may be prescribed for children as young as 12 years of age for selected conditions, precautions do apply. Overdosage: If you think you have taken too much of this medicine contact a poison control center or emergency room at once. NOTE: This medicine is only for you. Do not share this medicine with others. What if I miss a dose? Try not to miss a dose. Your doctor or health care professional will tell you when your next injection is due. Notify the office if you are unable to keep an appointment. What may interact with this medicine? -medicines for diabetes -medicines that treat or prevent blood clots like warfarin -oxyphenbutazone -propranolol -steroid medicines like prednisone or cortisone This list may not describe all possible interactions. Give your health care provider a list of all the medicines, herbs,  non-prescription drugs, or dietary supplements you use. Also tell them if you smoke, drink alcohol, or use illegal drugs. Some items may interact with your medicine. What should I watch for while using this medicine? Visit your doctor or health care professional for regular checks on your progress. They will need to check the level of testosterone in your blood. This medicine is only approved for use in men who have low levels of testosterone related to certain medical conditions. Heart attacks and strokes have been reported with the use of this medicine. Notify your doctor or health care professional and seek emergency treatment if you develop breathing problems; changes in vision; confusion; chest pain or chest tightness; sudden arm pain; severe, sudden headache; trouble speaking or understanding; sudden numbness or weakness of the face, arm or leg; loss of balance or coordination. Talk to your doctor about the risks and benefits of this medicine. This medicine may affect blood sugar levels. If you have diabetes, check with your doctor or health care professional before you change your diet or the dose of your diabetic medicine. This drug is banned from use in athletes by most athletic organizations. What side effects may I notice from receiving this medicine? Side effects that you should report to your doctor or health care professional as soon as possible: -allergic reactions like skin rash, itching or hives, swelling of the face, lips, or tongue -breast enlargement -breathing problems -changes in mood, especially anger, depression, or rage -dark urine -general ill feeling or flu-like symptoms -light-colored stools -loss of appetite, nausea -nausea, vomiting -right upper belly pain -stomach pain -swelling of ankles -too frequent or persistent erections -trouble passing urine or change in the amount of urine -unusually   weak or tired -yellowing of the eyes or skin Additional side effects  that can occur in women include: -deep or hoarse voice -facial hair growth -irregular menstrual periods Side effects that usually do not require medical attention (report to your doctor or health care professional if they continue or are bothersome): -acne -change in sex drive or performance -hair loss -headache This list may not describe all possible side effects. Call your doctor for medical advice about side effects. You may report side effects to FDA at 1-800-FDA-1088. Where should I keep my medicine? Keep out of the reach of children. This medicine can be abused. Keep your medicine in a safe place to protect it from theft. Do not share this medicine with anyone. Selling or giving away this medicine is dangerous and against the law. Store at room temperature between 20 and 25 degrees C (68 and 77 degrees F). Do not freeze. Protect from light. Follow the directions for the product you are prescribed. Throw away any unused medicine after the expiration date. NOTE: This sheet is a summary. It may not cover all possible information. If you have questions about this medicine, talk to your doctor, pharmacist, or health care provider.  2015, Elsevier/Gold Standard. (2014-02-02 16:10:96)   Since you haven't responded to two different topical testosterone medications, I recommend starting injections.  They should be given in the office, but you need to get the medication from the pharmacy.  It will be one shot every 2 weeks for the first month, then once a month. We should recheck your testosterone level in about 2 months, after you start the medication.  Schedule the lab visit for 2 months when you come for your first shot (in case it takes a while for you to get the medication and get it administered).

## 2015-07-16 ENCOUNTER — Other Ambulatory Visit (INDEPENDENT_AMBULATORY_CARE_PROVIDER_SITE_OTHER): Payer: Managed Care, Other (non HMO)

## 2015-07-16 DIAGNOSIS — E291 Testicular hypofunction: Secondary | ICD-10-CM | POA: Diagnosis not present

## 2015-07-16 DIAGNOSIS — E349 Endocrine disorder, unspecified: Secondary | ICD-10-CM

## 2015-07-16 MED ORDER — TESTOSTERONE CYPIONATE 200 MG/ML IM SOLN
200.0000 mg | Freq: Once | INTRAMUSCULAR | Status: AC
  Administered 2015-07-16: 200 mg via INTRAMUSCULAR

## 2015-07-30 ENCOUNTER — Other Ambulatory Visit: Payer: Self-pay

## 2015-07-31 ENCOUNTER — Other Ambulatory Visit (INDEPENDENT_AMBULATORY_CARE_PROVIDER_SITE_OTHER): Payer: Managed Care, Other (non HMO)

## 2015-07-31 DIAGNOSIS — E291 Testicular hypofunction: Secondary | ICD-10-CM

## 2015-07-31 DIAGNOSIS — R7989 Other specified abnormal findings of blood chemistry: Secondary | ICD-10-CM

## 2015-07-31 MED ORDER — TESTOSTERONE CYPIONATE 200 MG/ML IM SOLN
200.0000 mg | Freq: Once | INTRAMUSCULAR | Status: AC
  Administered 2015-07-31: 200 mg via INTRAMUSCULAR

## 2015-10-12 ENCOUNTER — Other Ambulatory Visit: Payer: Self-pay | Admitting: Family Medicine

## 2015-12-31 ENCOUNTER — Encounter: Payer: Self-pay | Admitting: Medical

## 2015-12-31 ENCOUNTER — Ambulatory Visit (INDEPENDENT_AMBULATORY_CARE_PROVIDER_SITE_OTHER): Payer: Managed Care, Other (non HMO) | Admitting: Medical

## 2015-12-31 VITALS — BP 120/80 | HR 77 | Temp 97.7°F | Resp 14 | Wt 242.0 lb

## 2015-12-31 DIAGNOSIS — R05 Cough: Secondary | ICD-10-CM

## 2015-12-31 DIAGNOSIS — R059 Cough, unspecified: Secondary | ICD-10-CM

## 2015-12-31 DIAGNOSIS — R058 Other specified cough: Secondary | ICD-10-CM

## 2015-12-31 MED ORDER — PREDNISONE 10 MG PO TABS: ORAL_TABLET | ORAL | Status: DC

## 2015-12-31 NOTE — Progress Notes (Signed)
Subjective: Chief Complaint  Patient presents with  . Cough    been going on for about a month. not from his chest. said it feels like "water" is in his lungs. has been to urgent care and they prescribed promethazine. states it did not help   Here for about 3-4 week hx/o cough.   Went to Urgent Care about 3 weeks ago, took Amoxicillin and Promethazine DM.  Seemed to get some relief, but still symptoms linger.  Initially had sore throat, fever, sick feeling for 1-2 week prior to Urgent Care visit .  No sick contacts.   Using Robitussin DM currently.   He is a nonsmoker.   Works as a [employer].  Denies any recent exposures.  In recent years, colds seem to lead to lingering cough, where he has to get something from the doctor.  OTC meds typically don't seem to work.  No SOB, no wheezing.  No other aggravating or relieving factors. No other complaint.   Past Medical History  Diagnosis Date  . GERD (gastroesophageal reflux disease)     15 yrs ago, better with dietary changes  . Heart murmur     normal evaluation in past (cardiologist)  . Nocturia   . Low testosterone 2014  . Insomnia   . Vitamin D deficiency    ROS as in subjective  Objective: BP 120/80 mmHg  Pulse 77  Temp(Src) 97.7 F (36.5 C) (Tympanic)  Resp 14  Wt 242 lb (109.77 kg)  SpO2 98%  General appearance: alert, no distress, WD/WN HEENT: normocephalic, sclerae anicteric, TMs pearly, nares patent, no discharge or erythema, pharynx normal Oral cavity: MMM, no lesions Neck: supple, no lymphadenopathy, no thyromegaly, no masses Heart: RRR, normal S1, S2, no murmurs Lungs: CTA bilaterally, no wheezes, rhonchi, or rales Ext: no edema Pulses: 2+ symmetric, upper and lower extremities, normal cap refill   Assessment: Encounter Diagnoses  Name Primary?  . Cough Yes  . Post-viral cough syndrome      Plan: Lungs clear ,discussed symptoms and exam suggesting lingering post viral cough.   He has Lawyer at  home.   He can use the Perles during the day, begin prednisone taper, benadryl QHS, and if not back to normal within 7-10 days recheck and get CXR.  F/u prn.

## 2016-01-01 ENCOUNTER — Ambulatory Visit: Payer: Managed Care, Other (non HMO) | Admitting: Family Medicine

## 2016-02-21 ENCOUNTER — Encounter: Payer: Self-pay | Admitting: Family Medicine

## 2016-02-21 ENCOUNTER — Ambulatory Visit (INDEPENDENT_AMBULATORY_CARE_PROVIDER_SITE_OTHER): Payer: Managed Care, Other (non HMO) | Admitting: Family Medicine

## 2016-02-21 VITALS — BP 128/88 | HR 80 | Temp 97.1°F | Ht 68.5 in | Wt 243.2 lb

## 2016-02-21 DIAGNOSIS — J309 Allergic rhinitis, unspecified: Secondary | ICD-10-CM

## 2016-02-21 DIAGNOSIS — R05 Cough: Secondary | ICD-10-CM | POA: Diagnosis not present

## 2016-02-21 DIAGNOSIS — R059 Cough, unspecified: Secondary | ICD-10-CM

## 2016-02-21 MED ORDER — HYDROCODONE-HOMATROPINE 5-1.5 MG/5ML PO SYRP: 5.0000 mL | ORAL_SOLUTION | Freq: Three times a day (TID) | ORAL | Status: DC | PRN

## 2016-02-21 NOTE — Patient Instructions (Addendum)
Please restart taking a vitamin D supplement every day (at least 1000 IU daily).   Drink plenty of water. Take zyrtec or claritin or Allegra once daily. Don't use the benadryl at night (because the cough syrup will also make you sleepy). Use the cough syrup at bedtime. Continue the mucinex DM or plain mucinex to keep the mucus and phlegm thin, twice daily Take the benzonatate as needed for cough during the day. Consider starting a nasal spray such as Flonase to also help with allergies--this won't work quickly, so needs to be overlapped with the antihistamine (claritin) for a week.  If cough persists/worsens, we may need to do a chest x-ray, but your lungs were completely clear today.

## 2016-02-21 NOTE — Progress Notes (Signed)
Chief Complaint  Patient presents with  . Cough    went to UC early Jan, saw Vincenza HewsShane end of January-still has cough and it is worse.    Patient presents with complaint of cough.  He was treated the end of January by Vincenza HewsShane with Tessalon and a course of prednisone. He states that his symptoms had gotten better, but 3 weeks ago had recurrent symptoms.  3 weeks ago he had a flu-like illness (body aches, congestion, runny nose, cough).  He has some nasal runny nose and postnasal drip just in the mornings, sometimes during the day.  Nasal drainage is yellow in the morning, clears up later in the day.  He coughs up very dark phlegm in the morning, and clear throughout the day.  He describes a constant cough, mostly dry (but often gets small amount of thin clear mucus).   He has been using Vick's and Mucinex DM without benefit.  He also tried Benadryl--put him to sleep, didn't help the cough. Denies shortness of breath. Cough is the same at night as during the day.  Stops coughing by 3-4 am.  Distant h/o reflux, not for many years  PMH, PSH, SH reviewed.  Outpatient Encounter Prescriptions as of 02/21/2016  Medication Sig Note  . dextromethorphan-guaiFENesin (MUCINEX DM) 30-600 MG 12hr tablet Take 1 tablet by mouth 2 (two) times daily.   . cholecalciferol (VITAMIN D) 1000 UNITS tablet Take 2,000 Units by mouth daily. Reported on 02/21/2016 02/21/2016: Hasn't taken in about 2 months  . HYDROcodone-homatropine (HYCODAN) 5-1.5 MG/5ML syrup Take 5 mLs by mouth every 8 (eight) hours as needed for cough.   . [DISCONTINUED] ergocalciferol (VITAMIN D2) 50000 UNITS capsule Take 1 capsule (50,000 Units total) by mouth once a week. (Patient not taking: Reported on 12/31/2015)   . [DISCONTINUED] predniSONE (DELTASONE) 10 MG tablet 6/5/4/3/2/1 taper   . [DISCONTINUED] Testosterone (ANDROGEL) 20.25 MG/1.25GM (1.62%) GEL Apply 4 pumps to skin each morning (Patient not taking: Reported on 12/31/2015) 07/12/2015: Using 2 pumps  BID  . [DISCONTINUED] testosterone cypionate (DEPOTESTOSTERONE CYPIONATE) 200 MG/ML injection 200mg  (1 mL) intramuscularly every 2 weeks for a month, then once monthly (Patient not taking: Reported on 02/21/2016) 02/21/2016: Stopped--caused decreased libido, and expensive   No facility-administered encounter medications on file as of 02/21/2016.   (hycodan rx'd today, not prior to visit).  Stopped the testosterone injections--decreased his sex drive, and was expensive.  No Known Allergies   ROs: +headache from coughing spells (only when coughing). No further fever, chills, no nausea, vomiting, diarrhea, rashes, bleeding, bruising. Libido improved after stopping testosterone injections. See HPI  PHYSICAL EXAM: BP 128/88 mmHg  Pulse 80  Temp(Src) 97.1 F (36.2 C) (Tympanic)  Ht 5' 8.5" (1.74 m)  Wt 243 lb 3.2 oz (110.315 kg)  BMI 36.44 kg/m2 Well developed, pleasant male, in no distress. No significant coughing during visit HEENT: PERRL, EOMI, conjunctiva and sclera are clear.  TM's and EAC's are normal. Nasal mucosa is moderately edematous, no erythema or purulence Sinuses nontender OP is clear Neck: no lymphadenopathy, thyromegaly or mass Heart: regular rate and rhythm Lungs: clear bilaterally, no wheezes, rales, ronchi Extremities: no edema Neuro: alert and oriented. Cranial nerves intact, normal gait Psych: normal mood, affect, hygiene and grooming  ASSESSMENT/PLAN:  Cough - Plan: HYDROcodone-homatropine (HYCODAN) 5-1.5 MG/5ML syrup  Allergic rhinitis, unspecified allergic rhinitis type   Drink plenty of water. Take zyrtec or claritin or Allegra once daily. Don't use the benadryl at night (because the cough syrup will also  make you sleepy). Use the cough syrup at bedtime. Continue the mucinex DM or plain mucinex to keep the mucus and phlegm thin, twice daily Take the benzonatate as needed for cough during the day. Consider starting a nasal spray such as Flonase to also  help with allergies--this won't work quickly, so needs to be overlapped with the antihistamine (claritin) for a week.  If cough persists/worsens, we may need to do a chest x-ray, but your lungs were completely clear today.

## 2017-06-02 ENCOUNTER — Ambulatory Visit (INDEPENDENT_AMBULATORY_CARE_PROVIDER_SITE_OTHER): Payer: 59 | Admitting: Family Medicine

## 2017-06-02 ENCOUNTER — Encounter: Payer: Self-pay | Admitting: Family Medicine

## 2017-06-02 VITALS — BP 130/90 | HR 80 | Temp 98.0°F | Resp 16 | Wt 238.4 lb

## 2017-06-02 DIAGNOSIS — R030 Elevated blood-pressure reading, without diagnosis of hypertension: Secondary | ICD-10-CM

## 2017-06-02 DIAGNOSIS — R5383 Other fatigue: Secondary | ICD-10-CM

## 2017-06-02 DIAGNOSIS — E559 Vitamin D deficiency, unspecified: Secondary | ICD-10-CM | POA: Diagnosis not present

## 2017-06-02 DIAGNOSIS — R002 Palpitations: Secondary | ICD-10-CM | POA: Diagnosis not present

## 2017-06-02 DIAGNOSIS — R42 Dizziness and giddiness: Secondary | ICD-10-CM | POA: Diagnosis not present

## 2017-06-02 LAB — CBC WITH DIFFERENTIAL/PLATELET
BASOS ABS: 0 {cells}/uL (ref 0–200)
Basophils Relative: 0 %
Eosinophils Absolute: 105 cells/uL (ref 15–500)
Eosinophils Relative: 3 %
HCT: 41.5 % (ref 38.5–50.0)
HEMOGLOBIN: 13.4 g/dL (ref 13.2–17.1)
LYMPHS ABS: 1680 {cells}/uL (ref 850–3900)
LYMPHS PCT: 48 %
MCH: 26.8 pg — AB (ref 27.0–33.0)
MCHC: 32.3 g/dL (ref 32.0–36.0)
MCV: 83 fL (ref 80.0–100.0)
MONO ABS: 315 {cells}/uL (ref 200–950)
MPV: 11.6 fL (ref 7.5–12.5)
Monocytes Relative: 9 %
Neutro Abs: 1400 cells/uL — ABNORMAL LOW (ref 1500–7800)
Neutrophils Relative %: 40 %
Platelets: 139 10*3/uL — ABNORMAL LOW (ref 140–400)
RBC: 5 MIL/uL (ref 4.20–5.80)
RDW: 15.1 % — AB (ref 11.0–15.0)
WBC: 3.5 10*3/uL — AB (ref 4.0–10.5)

## 2017-06-02 LAB — TSH: TSH: 2.21 mIU/L (ref 0.40–4.50)

## 2017-06-02 LAB — COMPREHENSIVE METABOLIC PANEL
ALBUMIN: 3.9 g/dL (ref 3.6–5.1)
ALT: 22 U/L (ref 9–46)
AST: 14 U/L (ref 10–40)
Alkaline Phosphatase: 68 U/L (ref 40–115)
BUN: 12 mg/dL (ref 7–25)
CALCIUM: 8.8 mg/dL (ref 8.6–10.3)
CHLORIDE: 103 mmol/L (ref 98–110)
CO2: 27 mmol/L (ref 20–31)
Creat: 1.06 mg/dL (ref 0.60–1.35)
GLUCOSE: 93 mg/dL (ref 65–99)
Potassium: 4.1 mmol/L (ref 3.5–5.3)
Sodium: 139 mmol/L (ref 135–146)
Total Bilirubin: 0.4 mg/dL (ref 0.2–1.2)
Total Protein: 6.9 g/dL (ref 6.1–8.1)

## 2017-06-02 NOTE — Progress Notes (Signed)
Subjective:    Patient ID: James Townsend, male    DOB: Oct 23, 1971, 46 y.o.   MRN: [id number]  HPI Chief Complaint  Patient presents with  . multi-issues    feeling foggy the last 2 months. light headed off and on, headache, heart flutters   He normally sees Dr. Lynelle Doctor but here today to see me with several complaints.  Complains of feeling "foggy" and dizzy occasionally in the mornings for the past 2 months. States this happens 2-3 days per week and lasts approximately 5-15 minutes. He has also noted some dizziness when he bends over or lays down but this is less often.  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   He changes topics frequently and it is somewhat difficult gathering information. He is somewhat anxious appearing and concerned about his health.   States he has a history of fatigue and has not been taking his vitamin D regularly as he was told to do. Would like to have this checked today.   Left shoulder pain with movement for the past 2 months. States he had it examined and was told he has a small tear. No new pain or symptoms.   Occasionally he feels like his heart is beating fast when he lays down only. Has not actually checked his pulse. No chest pain or shortness of breath.  States his BP is running high at home when his wife checks it and it is in the 140s/90s. States he is concerned about his heart and would like to be checked out. Per his PMH, he has been evaluated by a cardiologist in the past.   Denies fever, chills, night sweats, unexplained weight loss, chest pain, shortness of breath, orthopnea, cough, abdominal pain, N/V/D, urinary symptoms, LE edema.    No family history of heart disease.  He does not smoke or drink alcohol.   Past Medical History:  Diagnosis Date  . GERD (gastroesophageal reflux disease)    15 yrs  ago, better with dietary changes  . Heart murmur    normal evaluation in past (cardiologist)  . Insomnia   . Low testosterone 2014  . Nocturia   . Vitamin D deficiency      Review of Systems Pertinent positives and negatives in the history of present illness.     Objective:   Physical Exam  Constitutional: He is oriented to person, place, and time. He appears well-nourished. No distress.  HENT:  Right Ear: Tympanic membrane and ear canal normal.  Left Ear: Tympanic membrane and ear canal normal.  Nose: Nose normal. Right sinus exhibits no maxillary sinus tenderness and no frontal sinus tenderness. Left sinus exhibits no maxillary sinus tenderness and no frontal sinus tenderness.  Mouth/Throat: Uvula is midline, oropharynx is clear and moist and mucous membranes are normal.  Eyes: Conjunctivae, EOM and lids are normal. Pupils are equal, round, and reactive to light.  Neck: Normal range of motion. Neck supple. No JVD present. No thyromegaly present.  Cardiovascular: Normal rate, regular rhythm, normal heart sounds and normal pulses.  Exam reveals no gallop and no friction rub.   No murmur heard. Pulmonary/Chest: Effort normal and breath sounds normal.  Musculoskeletal:       Left shoulder: Normal.  Negative Neer's and Hawkins  Neurological: He is alert and oriented to person, place,  and time. He has normal strength and normal reflexes. No cranial nerve deficit or sensory deficit. Coordination and gait normal.  Skin: Skin is warm and dry. No rash noted. No pallor.  Psychiatric: He has a normal mood and affect. His speech is normal. Thought content normal. Cognition and memory are normal.   BP 130/90 (BP Location: Right Arm, Patient Position: Standing, Cuff Size: Large)   Pulse 80   Temp 98 F (36.7 C) (Oral)   Resp 16   Wt 238 lb 6.4 oz (108.1 kg)   SpO2 98%   BMI 35.72 kg/m       Assessment & Plan:  Dizziness - Plan: CBC with Differential/Platelet, Comprehensive metabolic  panel, TSH  Fatigue, unspecified type - Plan: CBC with Differential/Platelet, Comprehensive metabolic panel, TSH, VITAMIN D 25 Hydroxy (Vit-D Deficiency, Fractures)  Vitamin D deficiency - Plan: VITAMIN D 25 Hydroxy (Vit-D Deficiency, Fractures)  Palpitations - Plan: EKG 12-Lead  Elevated BP without diagnosis of hypertension  EKG done and sinus brady without any abnormal changes. Reviewed by myself and Dr. Susann GivensLalonde.  Demonstrated to the patient how to check his pulse and encouraged him to do this the next time he feels like his heart is racing.  Dizziness sounds very suspicious for BPPV but unable to reproduce his symptoms in the office. He may try meclizine the next time he has dizziness and see if this helps.  His blood pressure is above goal today and advised him to check his BP at home and bring in his BP cuff and readings to his appointment with Dr. Lynelle DoctorKnapp. Recommend he return to see her in 4 weeks or sooner if he is having any new or worsening symptoms.  Will check labs and follow up.

## 2017-06-02 NOTE — Patient Instructions (Addendum)
Check your BP and pulse at home 2-3 times per week and write down the readings.  Bring these in to your next visit with Dr. Lynelle DoctorKnapp in 4 weeks.   As demonstrated, check you pulse the next time you feel like your heart is beating fast and see if it actually feels fast or irregular.   Your dizziness in the mornings and other times with position changes sounds like benign positional vertigo. You may try an over the counter meclizine or Bonine and see if this helps. Stay well hydrated. Only take this medication if you are not having to drive or operate machinery the first few doses. It may make you drowsy.   Keep track of any new or worsening symptoms and write these down as well.   Follow up in 1 month for blood pressure, dizziness.

## 2017-06-03 LAB — VITAMIN D 25 HYDROXY (VIT D DEFICIENCY, FRACTURES): Vit D, 25-Hydroxy: 12 ng/mL — ABNORMAL LOW (ref 30–100)

## 2017-06-04 ENCOUNTER — Other Ambulatory Visit: Payer: Self-pay | Admitting: Family Medicine

## 2017-06-04 MED ORDER — VITAMIN D (ERGOCALCIFEROL) 1.25 MG (50000 UNIT) PO CAPS: 50000.0000 [IU] | ORAL_CAPSULE | ORAL | 0 refills | Status: DC

## 2017-06-08 ENCOUNTER — Ambulatory Visit: Payer: Managed Care, Other (non HMO) | Admitting: Family Medicine

## 2017-07-06 ENCOUNTER — Ambulatory Visit: Payer: 59 | Admitting: Family Medicine

## 2017-07-19 NOTE — Progress Notes (Signed)
Chief Complaint  Patient presents with  . Follow-up    blood pressure and dizziness. BPs 130-140/80s    Patient presents for 6 week follow-up.  He reports feeling better.  Sometimes he still gets a slight headache behind the right eye, intermittently, otherwise denies any concerns. BP's 130's-140's/78-82.  He didn't bring in his BP cuff or list today.  He last saw James Townsend in early July with the following complaints:  "Complains of feeling "foggy" and dizzy occasionally in the mornings for the past 2 months. States this happens 2-3 days per week and lasts approximately 5-15 minutes. He has also noted some dizziness when he bends over or lays down but this is less often.  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  Occasionally he feels like his heart is beating fast when he lays down only. Has not actually checked his pulse. No chest pain or shortness of breath.  States his BP is running high at home when his wife checks it and it is in the 140s/90s. States he is concerned about his heart and would like to be checked out."  He was advised: EKG done and sinus brady without any abnormal changes. Demonstrated to the patient how to check his pulse and encouraged him to do this the next time he feels like his heart is racing.  Dizziness sounds very suspicious for BPPV but unable to reproduce his symptoms in the office. He may try meclizine the next time he has dizziness and see if this helps.  His blood pressure is above goal today and advised him to check his BP at home and bring in his BP cuff and readings to his appointment with Dr. Lynelle Townsend. Recommend he return to see her in 4 weeks or sooner if he is having any new or worsening symptoms.   Lab results from visit:  Vitamin D-OH was low at 12. He was prescribed 8 weeks of 50,000 IU ergocalciferol, then told  to start taking 1000 IU daily OTC D3.  He is compliant with taking the prescription.  He was very sporadic with taking 1000 IU of Vitamin D prior to that test.  Lab Results  Component Value Date   WBC 3.5 (L) 06/02/2017   HGB 13.4 06/02/2017   HCT 41.5 06/02/2017   MCV 83.0 06/02/2017   PLT 139 (L) 06/02/2017     Chemistry      Component Value Date/Time   NA 139 06/02/2017 0837   K 4.1 06/02/2017 0837   CL 103 06/02/2017 0837   CO2 27 06/02/2017 0837   BUN 12 06/02/2017 0837   CREATININE 1.06 06/02/2017 0837      Component Value Date/Time   CALCIUM 8.8 06/02/2017 0837   ALKPHOS 68 06/02/2017 0837   AST 14 06/02/2017 0837   ALT 22 06/02/2017 0837   BILITOT 0.4 06/02/2017 0837     Lab Results  Component Value Date   TSH 2.21 06/02/2017    Upon my review of chart--noted that he also has h/o hypogonadism.  Last check was in 07/2015, low at 145.  Previously treated with Axiron, then changed to Androgel for insurance reasons.  This low level was while on treatment.  He had one testosterone cypionate (DEPOTESTOSTERONE CYPIONATE) 200 MG/ML injection on 8/30 and was supposed to have f/u level checked after 2  mos of therapy.  He did not return for subsequent injections (or labs). Note in chart states that it was expensive and caused decreased libido so it was stopped.  Lab Results  Component Value Date   TESTOSTERONE 145 (L) 07/11/2015    He has only been seen for acute visits in our office since 07/2015, not any med checks or wellness exams.  He gets DOT physicals done through [facility].  Last visit was in April. Notes are scanned in and not accessible to me.  He started a new job, which required a new DOT (had another one in November).  BP was borderline "just made it" as acceptable. 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 This has been a little bit of an adjustment.  PMH, PSH, SH reviewed  Outpatient Encounter Prescriptions as of 07/20/2017  Medication Sig Note  .  Vitamin D, Ergocalciferol, (DRISDOL) 50000 units CAPS capsule Take 1 capsule (50,000 Units total) by mouth every 7 (seven) days.   Marland Kitchen aspirin EC 81 MG tablet Take 81 mg by mouth daily.   . cholecalciferol (VITAMIN D) 1000 UNITS tablet Take 2,000 Units by mouth daily. Reported on 02/21/2016 02/21/2016: Hasn't taken in about 2 months   No facility-administered encounter medications on file as of 07/20/2017.    No Known Allergies  ROS:  No fever, chills, URI symptoms.  Dizziness resolved.  Occasional headache per HPI.  No chest pain, palpitations (better, sometimes notices at night), edema, shortness of breath, cough, GI or GU complaints or other concerns.    PHYSICAL EXAM:  BP 132/78   Pulse 76   Resp 18   Ht 5' 8.5" (1.74 m)   Wt 242 lb 6.4 oz (110 kg)   SpO2 98%   BMI 36.32 kg/m   130/90 on repeat by MD, RA  Wt Readings from Last 3 Encounters:  07/20/17 242 lb 6.4 oz (110 kg)  06/02/17 238 lb 6.4 oz (108.1 kg)  02/21/16 243 lb 3.2 oz (110.3 kg)   Well appearing, pleasant, obese male in no distress HEENT: PERRL, EOMI, conjunctiva and sclera clear.   Nasal mucosa mod edematous, R>L with some yellow crusting on the right. Sinuses nontender. OP is clear Neck: no lymphadenopathy, thyromegaly or mass Heart: regular rate and rhythm, no murmur, rub, gallop or ectopy Lungs: clear bilaterally Chest:  Mild gynecomastia noted, fatty Lungs: clear bilaterally Abdomen: soft, nontender, no organomegaly or mass Extremities: no edema, normal pulses Psych: normal mood, affect, hygiene and grooming Neuro: alert and oriented, cranial nerves intact, normal gait.   ASSESSMENT/PLAN:  Elevated BP without diagnosis of hypertension - reviewed low sodium diet, need for regular exercise, weight loss. NV to check monitor; goals reviewed  Vitamin D deficiency - complete rx, then discussed proper OTC replacement (increase to 2000 IU from original rec). Recheck 3 mos  Sinus headache - suspect due to  allergies; avoid decongestants, if possible. sinus rinses, nonsedating antihistamines reviewed  Obesity, Class II, BMI 35-39.9 - counseled re: exercise, weight loss, diet, risks  Male hypogonadism - Encouraged weight loss.  Recheck at 3 month f/u.  F/u 3 mos on BP, weight. Will need labs done. Morning appt so can check testosterone.  Recheck D to see if additional rx needed (suspect it will be).    Continue to monitor your blood pressure.  Feel free to schedule a nurse visit to verify the accuracy of your monitor.  Your blood pressure is okay (borderline).  Limiting salt/sodium in your diet (packing healthy  food/snacks while driving), getting regular exercise and losing some weight should also help the blood pressure.  I suggest using nasal saline spray, and/or Neti-pot sinus rinses when you have the sinus pain/headaches.  You may also use claritin or allegra as needed when congested.  Decongestants ("sinus meds") can raise the blood pressure, so use with caution and monitor your blood pressure.  Take the full 8 weeks of the prescription vitamin D.  Once you take the last capsule, be sure to start taking the over-the-counter Vitamin D3 regularly.  I don't think 1000 IU is enough to get your levels up to normal fast enough, so I recommend increasing to 2000 IU daily.  As we discussed, if you miss a day, it is okay to take double the next day.  In the future, you may want to get a 2000 IU pill (rather than 1000).  It is okay to take up to 5000 IU in a day, not more. Try and get into a regular routine of taking medications on a regular basis (when you brush your teeth, evening vs morning routine).  Try and get 150 minutes of aerobic exercise each week, as we discussed (can be in 10-15 minute intervals--on driving breaks, early morning, stay in hotels with a fitness, etc).  Losing weight will help your overall health.  Return in 3 months.  Bring your monitor to your visit (feel free to bring it  earlier to a nurse visit to have it checked).

## 2017-07-20 ENCOUNTER — Ambulatory Visit (INDEPENDENT_AMBULATORY_CARE_PROVIDER_SITE_OTHER): Payer: 59 | Admitting: Family Medicine

## 2017-07-20 VITALS — BP 132/78 | HR 76 | Resp 18 | Ht 68.5 in | Wt 242.4 lb

## 2017-07-20 DIAGNOSIS — E291 Testicular hypofunction: Secondary | ICD-10-CM | POA: Diagnosis not present

## 2017-07-20 DIAGNOSIS — E559 Vitamin D deficiency, unspecified: Secondary | ICD-10-CM | POA: Diagnosis not present

## 2017-07-20 DIAGNOSIS — R51 Headache: Secondary | ICD-10-CM | POA: Diagnosis not present

## 2017-07-20 DIAGNOSIS — E669 Obesity, unspecified: Secondary | ICD-10-CM

## 2017-07-20 DIAGNOSIS — R030 Elevated blood-pressure reading, without diagnosis of hypertension: Secondary | ICD-10-CM | POA: Diagnosis not present

## 2017-07-20 DIAGNOSIS — R519 Headache, unspecified: Secondary | ICD-10-CM

## 2017-07-20 NOTE — Patient Instructions (Signed)
Continue to monitor your blood pressure.  Feel free to schedule a nurse visit to verify the accuracy of your monitor.  Your blood pressure is okay (borderline).  Limiting salt/sodium in your diet (packing healthy food/snacks while driving), getting regular exercise and losing some weight should also help the blood pressure.  I suggest using nasal saline spray, and/or Neti-pot sinus rinses when you have the sinus pain/headaches.  You may also use claritin or allegra as needed when congested.  Decongestants ("sinus meds") can raise the blood pressure, so use with caution and monitor your blood pressure.  Take the full 8 weeks of the prescription vitamin D.  Once you take the last capsule, be sure to start taking the over-the-counter Vitamin D3 regularly.  I don't think 1000 IU is enough to get your levels up to normal fast enough, so I recommend increasing to 2000 IU daily.  As we discussed, if you miss a day, it is okay to take double the next day.  In the future, you may want to get a 2000 IU pill (rather than 1000).  It is okay to take up to 5000 IU in a day, not more. Try and get into a regular routine of taking medications on a regular basis (when you brush your teeth, evening vs morning routine).  Try and get 150 minutes of aerobic exercise each week, as we discussed (can be in 10-15 minute intervals--on driving breaks, early morning, stay in hotels with a fitness, etc).  Losing weight will help your overall health.  Return in 3 months.  Bring your monitor to your visit (feel free to bring it earlier to a nurse visit to have it checked).     DASH Eating Plan DASH stands for "Dietary Approaches to Stop Hypertension." The DASH eating plan is a healthy eating plan that has been shown to reduce high blood pressure (hypertension). It may also reduce your risk for type 2 diabetes, heart disease, and stroke. The DASH eating plan may also help with weight loss. What are tips for following this  plan? General guidelines  Avoid eating more than 2,300 mg (milligrams) of salt (sodium) a day. If you have hypertension, you may need to reduce your sodium intake to 1,500 mg a day.  Limit alcohol intake to no more than 1 drink a day for nonpregnant women and 2 drinks a day for men. One drink equals 12 oz of beer, 5 oz of wine, or 1 oz of hard liquor.  Work with your health care provider to maintain a healthy body weight or to lose weight. Ask what an ideal weight is for you.  Get at least 30 minutes of exercise that causes your heart to beat faster (aerobic exercise) most days of the week. Activities may include walking, swimming, or biking.  Work with your health care provider or diet and nutrition specialist (dietitian) to adjust your eating plan to your individual calorie needs. Reading food labels  Check food labels for the amount of sodium per serving. Choose foods with less than 5 percent of the Daily Value of sodium. Generally, foods with less than 300 mg of sodium per serving fit into this eating plan.  To find whole grains, look for the word "whole" as the first word in the ingredient list. Shopping  Buy products labeled as "low-sodium" or "no salt added."  Buy fresh foods. Avoid canned foods and premade or frozen meals. Cooking  Avoid adding salt when cooking. Use salt-free seasonings or herbs instead  of table salt or sea salt. Check with your health care provider or pharmacist before using salt substitutes.  Do not fry foods. Cook foods using healthy methods such as baking, boiling, grilling, and broiling instead.  Cook with heart-healthy oils, such as olive, canola, soybean, or sunflower oil. Meal planning   Eat a balanced diet that includes: ? 5 or more servings of fruits and vegetables each day. At each meal, try to fill half of your plate with fruits and vegetables. ? Up to 6-8 servings of whole grains each day. ? Less than 6 oz of lean meat, poultry, or fish each  day. A 3-oz serving of meat is about the same size as a deck of cards. One egg equals 1 oz. ? 2 servings of low-fat dairy each day. ? A serving of nuts, seeds, or beans 5 times each week. ? Heart-healthy fats. Healthy fats called Omega-3 fatty acids are found in foods such as flaxseeds and coldwater fish, like sardines, salmon, and mackerel.  Limit how much you eat of the following: ? Canned or prepackaged foods. ? Food that is high in trans fat, such as fried foods. ? Food that is high in saturated fat, such as fatty meat. ? Sweets, desserts, sugary drinks, and other foods with added sugar. ? Full-fat dairy products.  Do not salt foods before eating.  Try to eat at least 2 vegetarian meals each week.  Eat more home-cooked food and less restaurant, buffet, and fast food.  When eating at a restaurant, ask that your food be prepared with less salt or no salt, if possible. What foods are recommended? The items listed may not be a complete list. Talk with your dietitian about what dietary choices are best for you. Grains Whole-grain or whole-wheat bread. Whole-grain or whole-wheat pasta. Brown rice. Orpah Cobb. Bulgur. Whole-grain and low-sodium cereals. Pita bread. Low-fat, low-sodium crackers. Whole-wheat flour tortillas. Vegetables Fresh or frozen vegetables (raw, steamed, roasted, or grilled). Low-sodium or reduced-sodium tomato and vegetable juice. Low-sodium or reduced-sodium tomato sauce and tomato paste. Low-sodium or reduced-sodium canned vegetables. Fruits All fresh, dried, or frozen fruit. Canned fruit in natural juice (without added sugar). Meat and other protein foods Skinless chicken or [place]. Ground chicken or [place]. Pork with fat trimmed off. Fish and seafood. Egg whites. Dried beans, peas, or lentils. Unsalted nuts, nut butters, and seeds. Unsalted canned beans. Lean cuts of beef with fat trimmed off. Low-sodium, lean deli meat. Dairy Low-fat (1%) or fat-free (skim)  milk. Fat-free, low-fat, or reduced-fat cheeses. Nonfat, low-sodium ricotta or cottage cheese. Low-fat or nonfat yogurt. Low-fat, low-sodium cheese. Fats and oils Soft margarine without trans fats. Vegetable oil. Low-fat, reduced-fat, or light mayonnaise and salad dressings (reduced-sodium). Canola, safflower, olive, soybean, and sunflower oils. Avocado. Seasoning and other foods Herbs. Spices. Seasoning mixes without salt. Unsalted popcorn and pretzels. Fat-free sweets. What foods are not recommended? The items listed may not be a complete list. Talk with your dietitian about what dietary choices are best for you. Grains Baked goods made with fat, such as croissants, muffins, or some breads. Dry pasta or rice meal packs. Vegetables Creamed or fried vegetables. Vegetables in a cheese sauce. Regular canned vegetables (not low-sodium or reduced-sodium). Regular canned tomato sauce and paste (not low-sodium or reduced-sodium). Regular tomato and vegetable juice (not low-sodium or reduced-sodium). Rosita Fire. Olives. Fruits Canned fruit in a light or heavy syrup. Fried fruit. Fruit in cream or butter sauce. Meat and other protein foods Fatty cuts of meat. Ribs. Foy [place]  meat. Tomasa Blase. Sausage. Bologna and other processed lunch meats. Salami. Fatback. Hotdogs. Bratwurst. Salted nuts and seeds. Canned beans with added salt. Canned or smoked fish. Whole eggs or egg yolks. Chicken or [place] with skin. Dairy Whole or 2% milk, cream, and half-and-half. Whole or full-fat cream cheese. Whole-fat or sweetened yogurt. Full-fat cheese. Nondairy creamers. Whipped toppings. Processed cheese and cheese spreads. Fats and oils Butter. Stick margarine. Lard. Shortening. Ghee. Bacon fat. Tropical oils, such as coconut, palm kernel, or palm oil. Seasoning and other foods Salted popcorn and pretzels. Onion salt, garlic salt, seasoned salt, table salt, and sea salt. Worcestershire sauce. Tartar sauce. Barbecue sauce. Teriyaki  sauce. Soy sauce, including reduced-sodium. Steak sauce. Canned and packaged gravies. Fish sauce. Oyster sauce. Cocktail sauce. Horseradish that you find on the shelf. Ketchup. Mustard. Meat flavorings and tenderizers. Bouillon cubes. Hot sauce and Tabasco sauce. Premade or packaged marinades. Premade or packaged taco seasonings. Relishes. Regular salad dressings. Where to find more information:  National Heart, Lung, and Blood Institute: PopSteam.is  American Heart Association: www.heart.org Summary  The DASH eating plan is a healthy eating plan that has been shown to reduce high blood pressure (hypertension). It may also reduce your risk for type 2 diabetes, heart disease, and stroke.  With the DASH eating plan, you should limit salt (sodium) intake to 2,300 mg a day. If you have hypertension, you may need to reduce your sodium intake to 1,500 mg a day.  When on the DASH eating plan, aim to eat more fresh fruits and vegetables, whole grains, lean proteins, low-fat dairy, and heart-healthy fats.  Work with your health care provider or diet and nutrition specialist (dietitian) to adjust your eating plan to your individual calorie needs. This information is not intended to replace advice given to you by your health care provider. Make sure you discuss any questions you have with your health care provider. Document Released: 11/06/2011 Document Revised: 11/10/2016 Document Reviewed: 11/10/2016 Elsevier Interactive Patient Education  2017 [employer].

## 2017-07-21 ENCOUNTER — Encounter: Payer: Self-pay | Admitting: Family Medicine

## 2017-08-05 ENCOUNTER — Other Ambulatory Visit: Payer: Self-pay | Admitting: Family Medicine

## 2017-08-13 ENCOUNTER — Telehealth: Payer: Self-pay | Admitting: Family Medicine

## 2017-08-13 NOTE — Telephone Encounter (Signed)
Rcvd refill request for Vit D capsule 50,000 unit #8. Pt would like refill asap. Refill was denied last week. Was that correct?

## 2017-08-13 NOTE — Telephone Encounter (Signed)
yes--okay to do at his visit

## 2017-08-13 NOTE — Telephone Encounter (Signed)
Spoke with patient and he has a med check 10/12/17 (that would be 4 months from original lab date) wants to know if this is okay to do at this visit instead of coming in for another visit one month earlier?

## 2017-08-13 NOTE — Telephone Encounter (Signed)
Advise pt--he was supposed to take the prescription only for 8 weeks (the full rx prescribed by Vickie in July), then start taking 2000 IU of over-the-counter D3 daily, and we would recheck the level in 3 months to determine if additional rx is needed.

## 2017-08-29 ENCOUNTER — Other Ambulatory Visit: Payer: Self-pay | Admitting: Family Medicine

## 2017-10-12 ENCOUNTER — Encounter: Payer: 59 | Admitting: Family Medicine

## 2017-11-27 ENCOUNTER — Ambulatory Visit: Payer: 59 | Admitting: Family Medicine

## 2017-11-27 ENCOUNTER — Encounter: Payer: Self-pay | Admitting: Family Medicine

## 2017-11-27 VITALS — BP 132/86 | HR 88 | Temp 98.0°F | Ht 68.5 in | Wt 250.2 lb

## 2017-11-27 DIAGNOSIS — J069 Acute upper respiratory infection, unspecified: Secondary | ICD-10-CM | POA: Diagnosis not present

## 2017-11-27 DIAGNOSIS — J029 Acute pharyngitis, unspecified: Secondary | ICD-10-CM

## 2017-11-27 LAB — POCT RAPID STREP A (OFFICE): Rapid Strep A Screen: NEGATIVE

## 2017-11-27 NOTE — Patient Instructions (Signed)
  Drink plenty of water. Your lungs were clear, you had some nasal congestion on exam--I suspect postnasal drainage contributing to your sore throat and cough, due to a viral illness. Use tylenol as needed for any pain. Use Mucinex 12 hour twice daily to help break up the phlegm. You can either use the plain kind with a separate Delsym Syrup (or Alka selzer medications if you can verify that they don't contain guaifenesin as an ingredient in them). Or, you can stop the alka selzer medication and take Mucinex DM 12 hour twice daily.  Return if you develop fever, shortness of breath, or other worsening symptoms.  There is no evidence of a bacterial infection today.

## 2017-11-27 NOTE — Progress Notes (Signed)
Chief Complaint  Patient presents with  . Sore Throat    that started 2 weeks ago that went away and came back about 3 days ago. Slight cough that is producing a brownish mucus. Not mcuh drainage. No fevers.    3 weeks ago he started with sore throat and cough; he reports it got better, but then recurred 3-4 days ago.  He has some nasal congestion, no runny nose, occasional PND.  Cough seems to be worse when he is hot, and more at night.  He coughs up brown phlegm mostly in the morning and at night, not much during the day.    Denies chest congestion, pain, dyspnea on exertion.  No fever, chills.  +sick contact (wife was sick last week). He has taken Alka Selzer + cold and flu, helps some. He only takes it at night, not taking the daytime medication. Throat lozenges help some.  PMH, PSH SH reviewed  Outpatient Encounter Medications as of 11/27/2017  Medication Sig Note  . Chlorphen-Phenyleph-ASA (ALKA-SELTZER PLUS COLD PO) Take by mouth.   . Cholecalciferol (VITAMIN D3) 5000 units TABS Take 1 tablet by mouth once a week.   . [DISCONTINUED] aspirin EC 81 MG tablet Take 81 mg by mouth daily.   . [DISCONTINUED] cholecalciferol (VITAMIN D) 1000 UNITS tablet Take 2,000 Units by mouth daily. Reported on 02/21/2016 02/21/2016: Hasn't taken in about 2 months  . [DISCONTINUED] Vitamin D, Ergocalciferol, (DRISDOL) 50000 units CAPS capsule Take 1 capsule (50,000 Units total) by mouth every 7 (seven) days.    No facility-administered encounter medications on file as of 11/27/2017.    No Known Allergies  ROS: No fever, chills, headaches, chest pain, nausea, vomiting, diarrhea.  No rashes, ear pain, joint pains or other complaints  PHYSICAL EXAM:  BP 132/86   Pulse 88   Temp 98 F (36.7 C) (Tympanic)   Ht 5' 8.5" (1.74 m)   Wt 250 lb 3.2 oz (113.5 kg)   BMI 37.49 kg/m  Well appearing ,pleasant male in no distress HEENT: PERRL, EOMI, conjunctiva and sclera are clear. Nasal mucosa is mild-mod  edematous, mostly on the left with clear mucus. OP is clear. TM's and EAC's normal. Sinuses nontender Neck: no lymphadenopathy or mass Heart: regular rate and rhythm Lungs: clear bilaterally Skin: normal turgor, no rash Psych: normal mood, affect, hygiene and grooming Neuro: alert and oriented, normal gait, cranial nerves  Rapid strep is negative  ASSESSMENT/PLAN:  Viral upper respiratory tract infection  Sore throat - Plan: Rapid Strep A    Drink plenty of water. Your lungs were clear, you had some nasal congestion on exam--I suspect postnasal drainage contributing to your sore throat and cough, due to a viral illness. Use tylenol as needed for any pain. Use Mucinex 12 hour twice daily to help break up the phlegm. You can either use the plain kind with a separate Delsym Syrup (or Alka selzer medications if you can verify that they don't contain guaifenesin as an ingredient in them). Or, you can stop the alka selzer medication and take Mucinex DM 12 hour twice daily.  Return if you develop fever, shortness of breath, or other worsening symptoms.  There is no evidence of a bacterial infection today.

## 2017-11-28 ENCOUNTER — Encounter: Payer: Self-pay | Admitting: Family Medicine

## 2019-05-31 NOTE — Progress Notes (Signed)
Chief Complaint  Patient presents with  . Annual Exam    fasting annual exam. Has eye exam with eye doctor and will be doing that soon. Does mention that his energy level has decreased-thinks he needs his vitamin D level checked. Has form to be filled out for work.    James Townsend is a 48 y.o. male who presents for a complete physical.  He has the following concerns:  He is complaining of fatigue, especially after eating (not after fruit or vegetables, but with heavier meals, meats, unsure if relates to intake of carbs).  Seems to be 10-15 minutes after he eats that he feels wiped out/drained. No polydipsia, or polyuria.  Doesn't think he sleeps well at night.  He snores sometimes. His wife doesn't mention that he stops breathing.  Doesn't feel well rested, doesn't think he has good quality of sleep. Sometimes he gets up 6x/night to void; hasn't been as bad in the last 2 weeks.  Only recently cut out sugary drinks at night. He sweats a lot on the job.  H/o elevated BP's in the past. He monitors it at home, runs 120's-130's/80's (can't recall exactly).  h/o hypogonadism.  Last check was in 07/2015, low at 145.  Previously treated with Axiron, then changed to Androgel for insurance reasons.  This low level was while on treatment.  He had one testosterone cypionate (DEPOTESTOSTERONE CYPIONATE) 200 MG/ML injection on 8/30 and was supposed to have f/u level checked after 2 mos of therapy.  He did not return for subsequent injections (or labs). Note in chart states that it was expensive and caused decreased libido so it was stopped.  This was last discussed at his 07/2017 visit, at which time weight loss was recommended, and he was to schedule a 3 month follow-up to follow-up on blood pressure and recheck labs.  He did not return for this (just an acute visit).  Vitamin D deficiency:  Last level was low at 12 in 05/2017.  He was treated with prescription course at that time. He hasn't been taking any  vitamin D in about six months (just a couple of pills a few weeks ago that he had left over).   Immunization History  Administered Date(s) Administered  . Influenza-Unspecified 09/07/2017   He previously stated he had a tetanus shot within 2 years of his 2015 physical. He really can't recall, okay to get another. We never got any record. Last colonoscopy: once in his 20's (normal) Last PSA: 07/2015 0.6 Dentist: every 6 months Ophtho: every year Exercise: walks on the job (not aerobic), and sometimes with his wife (once a week, for about an hour). Has weights at home, uses 2x/week.  Lipid: Lab Results  Component Value Date   CHOL 140 09/07/2014   HDL 47 09/07/2014   LDLCALC 77 09/07/2014   TRIG 78 09/07/2014   CHOLHDL 3.0 09/07/2014    Past Medical History:  Diagnosis Date  . GERD (gastroesophageal reflux disease)    15 yrs ago, better with dietary changes  . Heart murmur    normal evaluation in past (cardiologist)  . Insomnia   . Low testosterone 2014  . Nocturia   . Vitamin D deficiency     Past Surgical History:  Procedure Laterality Date  . APPENDECTOMY  03/2010    Social History   Socioeconomic History  . Marital status: Married    Spouse name: Not on file  . Number of children: 3  . Years of education: Not on  file  . Highest education level: Not on file  Occupational History  . Occupation: truck Education administrator: [employer]  . Financial resource strain: Not on file  . Food insecurity    Worry: Not on file    Inability: Not on file  . Transportation needs    Medical: Not on file    Non-medical: Not on file  Tobacco Use  . Smoking status: Never Smoker  . Smokeless tobacco: Never Used  Substance and Sexual Activity  . Alcohol use: No    Comment: occasional wine (1x/month)  . Drug use: No  . Sexual activity: Yes    Partners: Female  Lifestyle  . Physical activity    Days per week: Not on file    Minutes per session: Not on  file  . Stress: Not on file  Relationships  . Social Herbalist on phone: Not on file    Gets together: Not on file    Attends religious service: Not on file    Active member of club or organization: Not on file    Attends meetings of clubs or organizations: Not on file    Relationship status: Not on file  . Intimate partner violence    Fear of current or ex partner: Not on file    Emotionally abused: Not on file    Physically abused: Not on file    Forced sexual activity: Not on file  Other Topics Concern  . Not on file  Social History Narrative   Lives with wife, daughter and son (home from [place]; daughter in [place]). 2 dogs   Short distance truck driver, local    Family History  Problem Relation Age of Onset  . Cancer Mother        breast cancer in 78's  . Breast cancer Mother        27's  . Heart disease Daughter        "hole in heart", congenital, resolved on its own  . Hypertension Father   . Hypertension Maternal Grandmother   . Diabetes Neg Hx   . Stroke Neg Hx     Outpatient Encounter Medications as of 06/01/2019  Medication Sig  . [DISCONTINUED] Chlorphen-Phenyleph-ASA (ALKA-SELTZER PLUS COLD PO) Take by mouth.  . [DISCONTINUED] Cholecalciferol (VITAMIN D3) 5000 units TABS Take 1 tablet by mouth once a week.   No facility-administered encounter medications on file as of 06/01/2019.     No Known Allergies  ROS:  The patient denies anorexia, weight changes, headaches, vision loss, decreased hearing, ear pain, hoarseness, chest pain, palpitations, dizziness, syncope, dyspnea on exertion, swelling, nausea, vomiting, diarrhea, constipation, abdominal pain, melena, hematochezia, indigestion/heartburn, hematuria, incontinence, weakened urine stream, dysuria, genital lesions, joint pains, numbness, tingling, weakness, tremor, suspicious skin lesions, depression, anxiety, abnormal bleeding/bruising, or enlarged lymph nodes. +fatigue, worse 10-15 minutes after  eating certain foods. Denies ED or decreased libido. Nocturia, though not in the last couple of weeks.   PHYSICAL EXAM:  BP 126/86   Pulse 80   Temp 98.4 F (36.9 C) (Temporal)   Ht 5' 9"  (1.753 m)   Wt 245 lb 3.2 oz (111.2 kg)   BMI 36.21 kg/m   Wt Readings from Last 3 Encounters:  06/01/19 245 lb 3.2 oz (111.2 kg)  11/27/17 250 lb 3.2 oz (113.5 kg)  07/20/17 242 lb 6.4 oz (110 kg)    General Appearance:    Alert, cooperative, no distress, appears stated age.  Head:    Normocephalic, without obvious abnormality, atraumatic  Eyes:    PERRL, conjunctiva clear, EOM's intact, fundi benign. Pterygium medially at the left eye  Ears:    Normal TM's and external ear canals, some nonobstructive cerumen on the right  Nose:   Not examined (wearing mask due to COVID-19 pandemic)  Throat:   Not examined (wearing mask due to COVID-19 pandemic)  Neck:   Supple, no lymphadenopathy;  thyroid:  no   enlargement/tenderness/nodules; no carotid   bruit or JVD  Back:    Spine nontender, no curvature, ROM normal, no CVA     tenderness  Lungs:     Clear bilaterally. Good air movement, without wheezes or ronchi; respirations unlabored  Chest Wall:    No tenderness or deformity   Heart:    Regular rate and rhythm, S1 and S2 normal, no murmur, rub   or gallop  Breast Exam:    No chest wall tenderness, masses or gynecomastia  Abdomen:     Soft, non-tender, nondistended, normoactive bowel sounds,    no masses, no hepatosplenomegaly  Genitalia:    Normal male external genitalia without lesions.  Testicles without masses, normal size.  No inguinal hernias.  Rectal:    Normal sphincter tone, no masses or tenderness; guaiac negative stool.  Prostate smooth, no nodules.  Extremities:   No clubbing, cyanosis or edema  Pulses:   2+ and symmetric all extremities  Skin:   Skin color, texture, turgor normal, no rashes or lesions  Lymph nodes:   Cervical, supraclavicular, and axillary nodes normal  Neurologic:    CNII-XII intact, normal strength, sensation and gait; reflexes 2+ and symmetric throughout                                Psych:   Normal mood, affect, hygiene and grooming.   ASSESSMENT/PLAN:  Annual physical exam - Plan: Lipid panel, Comprehensive metabolic panel, CBC with Differential/Platelet, VITAMIN D 25 Hydroxy (Vit-D Deficiency, Fractures), TSH, Hemoglobin A1c, PSA  Fatigue, unspecified type - Ddx reviewed--contrib by poor sleep, prob Vit D deficiency, low testosterone. Discussed potential OSA, consider sleep study   Vitamin D deficiency - noncompliant with supplements, suspect it will be very low again - Plan: VITAMIN D 25 Hydroxy (Vit-D Deficiency, Fractures)  Male hypogonadism - previously didn't tolerate testosterone injections, level didn't get to normal with other methods. No ED or decreased libido, not interested in recheck of level  Need for tetanus booster - risks/SE reviewed - Plan: Tdap vaccine greater than or equal to 7yo IM  Screening for prostate cancer - Plan: PSA  Nocturia - intermittent, not currently - Plan: Hemoglobin A1c  BMI 36.0-36.9,adult - discussed risks obesity, healthy diet, encouraged weight loss - Plan: Hemoglobin A1c    c-met, CBC, lipid, PSA, Vit D, TSH, A1c Testosterone--declines checking today--will consider checking it if fatigue doesn't improve after treating vitamin D levels.  Form to be filled out after labs back.  Discussed PSA screening (risks/benefits), recommended at least 30 minutes of aerobic activity at least 5 days/week; proper sunscreen use reviewed; healthy diet and alcohol recommendations (less than or equal to 2 drinks/day) reviewed; regular seatbelt use; changing batteries in smoke detectors.  Immunization recommendations discussed--TdaP given today. Yearly flu shots are recommended. Colonoscopy recommendations reviewed, age 42 (sooner if covered)  Consider DEXA at age 72 (sooner if he checks with insurance and is covered,  based on diagnosis of vit  D deficiency and hypogonadism)  Pt may check with his insurance to see if colonoscopy and/or DEXA would be covered now, rather than waiting.  Discussed sleep study, recommended discussing with wife; consider sleep study even if apnea not noted by [place], given unrefreshed sleep.   [facility]

## 2019-06-01 ENCOUNTER — Ambulatory Visit: Payer: 59 | Admitting: Family Medicine

## 2019-06-01 ENCOUNTER — Encounter: Payer: Self-pay | Admitting: Family Medicine

## 2019-06-01 ENCOUNTER — Other Ambulatory Visit: Payer: Self-pay

## 2019-06-01 VITALS — BP 126/86 | HR 80 | Temp 98.4°F | Ht 69.0 in | Wt 245.2 lb

## 2019-06-01 DIAGNOSIS — E559 Vitamin D deficiency, unspecified: Secondary | ICD-10-CM

## 2019-06-01 DIAGNOSIS — E291 Testicular hypofunction: Secondary | ICD-10-CM

## 2019-06-01 DIAGNOSIS — R5383 Other fatigue: Secondary | ICD-10-CM

## 2019-06-01 DIAGNOSIS — Z0000 Encounter for general adult medical examination without abnormal findings: Secondary | ICD-10-CM | POA: Diagnosis not present

## 2019-06-01 DIAGNOSIS — Z23 Encounter for immunization: Secondary | ICD-10-CM | POA: Diagnosis not present

## 2019-06-01 DIAGNOSIS — R351 Nocturia: Secondary | ICD-10-CM

## 2019-06-01 DIAGNOSIS — Z6836 Body mass index (BMI) 36.0-36.9, adult: Secondary | ICD-10-CM

## 2019-06-01 DIAGNOSIS — Z125 Encounter for screening for malignant neoplasm of prostate: Secondary | ICD-10-CM

## 2019-06-01 NOTE — Patient Instructions (Addendum)
  HEALTH MAINTENANCE RECOMMENDATIONS:  It is recommended that you get at least 30 minutes of aerobic exercise at least 5 days/week (for weight loss, you may need as much as 60-90 minutes). This can be any activity that gets your heart rate up. This can be divided in 10-15 minute intervals if needed, but try and build up your endurance at least once a week.  Weight bearing exercise is also recommended twice weekly.  Eat a healthy diet with lots of vegetables, fruits and fiber.  "Colorful" foods have a lot of vitamins (ie green vegetables, tomatoes, red peppers, etc).  Limit sweet tea, regular sodas and alcoholic beverages, all of which has a lot of calories and sugar.  Up to 2 alcoholic drinks daily may be beneficial for men (unless trying to lose weight, watch sugars).  Drink a lot of water.  Sunscreen of at least SPF 30 should be used on all sun-exposed parts of the skin when outside between the hours of 10 am and 4 pm (not just when at beach or pool, but even with exercise, golf, tennis, and yard work!)  Use a sunscreen that says "broad spectrum" so it covers both UVA and UVB rays, and make sure to reapply every 1-2 hours.  Remember to change the batteries in your smoke detectors when changing your clock times in the spring and fall.  Use your seat belt every time you are in a car, and please drive safely and not be distracted with cell phones and texting while driving.  Please contact your insurance to see if they cover routine screening colonocopy at your age, or if we need to wait until age 5 (due to being African American, at higher risk) You can also ask about bone density testing being covered for low testosterone (male hypogonadism) and vitamin D deficiency). I plan to try and get this done at age 52, but if it is covered now, no need for [place] to wait.  Given your poor sleep, feeling unrefreshed in the morning, and your weight, it is recommended for you to consider having a sleep study (to  look for sleep apnea).  I would especially want to consider this if your wife does report that you have pauses in your sleep at night.  Please let me know if/when you would like this test (especially if she agrees that you have apnea), and we can set it up. Weight loss is highly recommended

## 2019-06-02 ENCOUNTER — Encounter: Payer: Self-pay | Admitting: Family Medicine

## 2019-06-02 LAB — COMPREHENSIVE METABOLIC PANEL
ALT: 34 IU/L (ref 0–44)
AST: 22 IU/L (ref 0–40)
Albumin/Globulin Ratio: 1.7 (ref 1.2–2.2)
Albumin: 4.5 g/dL (ref 4.0–5.0)
Alkaline Phosphatase: 75 IU/L (ref 39–117)
BUN/Creatinine Ratio: 8 — ABNORMAL LOW (ref 9–20)
BUN: 9 mg/dL (ref 6–24)
Bilirubin Total: 0.3 mg/dL (ref 0.0–1.2)
CO2: 24 mmol/L (ref 20–29)
Calcium: 9.4 mg/dL (ref 8.7–10.2)
Chloride: 103 mmol/L (ref 96–106)
Creatinine, Ser: 1.17 mg/dL (ref 0.76–1.27)
GFR calc Af Amer: 85 mL/min/{1.73_m2} (ref >59)
GFR calc non Af Amer: 74 mL/min/{1.73_m2} (ref >59)
Globulin, Total: 2.7 g/dL (ref 1.5–4.5)
Glucose: 97 mg/dL (ref 65–99)
Potassium: 4.6 mmol/L (ref 3.5–5.2)
Sodium: 140 mmol/L (ref 134–144)
Total Protein: 7.2 g/dL (ref 6.0–8.5)

## 2019-06-02 LAB — CBC WITH DIFFERENTIAL/PLATELET
Basophils Absolute: 0 10*3/uL (ref 0.0–0.2)
Basos: 1 %
EOS (ABSOLUTE): 0.1 10*3/uL (ref 0.0–0.4)
Eos: 3 %
Hematocrit: 42 % (ref 37.5–51.0)
Hemoglobin: 13.5 g/dL (ref 13.0–17.7)
Immature Grans (Abs): 0 10*3/uL (ref 0.0–0.1)
Immature Granulocytes: 0 %
Lymphocytes Absolute: 1.5 10*3/uL (ref 0.7–3.1)
Lymphs: 35 %
MCH: 27 pg (ref 26.6–33.0)
MCHC: 32.1 g/dL (ref 31.5–35.7)
MCV: 84 fL (ref 79–97)
Monocytes Absolute: 0.4 10*3/uL (ref 0.1–0.9)
Monocytes: 9 %
Neutrophils Absolute: 2.3 10*3/uL (ref 1.4–7.0)
Neutrophils: 52 %
Platelets: 134 10*3/uL — ABNORMAL LOW (ref 150–450)
RBC: 5 x10E6/uL (ref 4.14–5.80)
RDW: 13.8 % (ref 11.6–15.4)
WBC: 4.4 10*3/uL (ref 3.4–10.8)

## 2019-06-02 LAB — LIPID PANEL
Chol/HDL Ratio: 2.5 ratio (ref 0.0–5.0)
Cholesterol, Total: 159 mg/dL (ref 100–199)
HDL: 63 mg/dL (ref >39)
LDL Calculated: 83 mg/dL (ref 0–99)
Triglycerides: 64 mg/dL (ref 0–149)
VLDL Cholesterol Cal: 13 mg/dL (ref 5–40)

## 2019-06-02 LAB — HEMOGLOBIN A1C
Est. average glucose Bld gHb Est-mCnc: 123 mg/dL
Hgb A1c MFr Bld: 5.9 % — ABNORMAL HIGH (ref 4.8–5.6)

## 2019-06-02 LAB — VITAMIN D 25 HYDROXY (VIT D DEFICIENCY, FRACTURES): Vit D, 25-Hydroxy: 13.3 ng/mL — ABNORMAL LOW (ref 30.0–100.0)

## 2019-06-02 LAB — TSH: TSH: 1.94 u[IU]/mL (ref 0.450–4.500)

## 2019-06-02 LAB — PSA: Prostate Specific Ag, Serum: 0.7 ng/mL (ref 0.0–4.0)

## 2019-06-02 MED ORDER — VITAMIN D (ERGOCALCIFEROL) 1.25 MG (50000 UNIT) PO CAPS: 50000.0000 [IU] | ORAL_CAPSULE | ORAL | 0 refills | Status: DC

## 2019-06-02 NOTE — Addendum Note (Signed)
Addended by: Rita Ohara on: 06/02/2019 07:13 AM   Modules accepted: Orders

## 2019-06-25 ENCOUNTER — Other Ambulatory Visit: Payer: Self-pay

## 2019-06-25 ENCOUNTER — Encounter: Payer: Self-pay | Admitting: Physician Assistant

## 2019-06-25 ENCOUNTER — Ambulatory Visit
Admission: EM | Admit: 2019-06-25 | Discharge: 2019-06-25 | Disposition: A | Discharge status: Home / Self Care | Payer: 59 | Attending: Physician Assistant | Admitting: Physician Assistant

## 2019-06-25 DIAGNOSIS — M25561 Pain in right knee: Secondary | ICD-10-CM | POA: Diagnosis not present

## 2019-06-25 MED ORDER — MELOXICAM 7.5 MG PO TABS: 7.5000 mg | ORAL_TABLET | Freq: Every day | ORAL | 0 refills | Status: DC

## 2019-06-25 NOTE — ED Triage Notes (Signed)
Per pt he has been having severe pain in his right knee that has been shooting up into his leg. Pt said he can't recall any injuries but did step down wrong about three weeks ago. No swelling or warm to touch

## 2019-06-25 NOTE — Discharge Instructions (Signed)
Start Mobic. Do not take ibuprofen (motrin/advil)/ naproxen (aleve) while on mobic. Ice compress, elevation, rest, knee brace during activity. Follow up with PCP if symptoms not improving.

## 2019-06-25 NOTE — ED Provider Notes (Signed)
[facility]    CSN: [id number] Arrival date & time: 06/25/19  1057     History   Chief Complaint Chief Complaint  Patient presents with  . Knee Pain    HPI James Townsend is a 48 y.o. male.   48 year old male comes in for 4 to 5-day history of right knee pain.  Denies obvious injury/trauma.  However, did mention that he stepped down the stairs from 3 weeks ago.  Denies any impact, twisting motion.  Denies any pain after incident.  4-5 days ago, started having pain to the right knee diffusely.  Denies swelling, erythema, warmth.  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  States climbing up stairs can make pain worse as well.  Has been taking ibuprofen without relief.     Past Medical History:  Diagnosis Date  . GERD (gastroesophageal reflux disease)    15 yrs ago, better with dietary changes  . Heart murmur    normal evaluation in past (cardiologist)  . Insomnia   . Low testosterone 2014  . Nocturia   . Vitamin D deficiency     Patient Active Problem List   Diagnosis Date Noted  . Other malaise and fatigue 03/02/2014  . 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 Nocturia 12/01/2013  . Vitamin D deficiency 07/26/2013  . Low testosterone 07/26/2013  . Insomnia 11/08/2012    Past Surgical History:  Procedure Laterality Date  . APPENDECTOMY  03/2010       Home Medications    Prior to Admission medications   Medication Sig Start Date End Date Taking? Authorizing Provider  meloxicam (MOBIC) 7.5 MG tablet Take 1 tablet (7.5 mg total) by mouth daily. 06/25/19   Tasia Catchings, Zikeria Keough V, PA-C  Vitamin D, Ergocalciferol, (DRISDOL) 1.25 MG (50000 UT) CAPS capsule Take 1 capsule (50,000 Units total) by mouth every 7 (seven) days. 06/02/19   Rita Ohara, MD    Family History Family History  Problem Relation Age of Onset  . Cancer Mother        breast cancer in 55's  . Breast cancer Mother        74's  . Heart disease Daughter         "hole in heart", congenital, resolved on its own  . Hypertension Father   . Hypertension Maternal Grandmother   . Diabetes Neg Hx   . Stroke Neg Hx     Social History Social History   Tobacco Use  . Smoking status: Never Smoker  . Smokeless tobacco: Never Used  Substance Use Topics  . Alcohol use: No    Comment: occasional wine (1x/month)  . Drug use: No     Allergies   Patient has no known allergies.   Review of Systems Review of Systems  Reason unable to perform ROS: See HPI as above.     Physical Exam Triage Vital Signs ED Triage Vitals  Enc Vitals Group     BP 06/25/19 1105 (!) 147/88     Pulse Rate 06/25/19 1105 89     Resp 06/25/19 1105 16     Temp 06/25/19 1105 98.7 F (37.1 C)     Temp Source 06/25/19 1105 Oral     SpO2 06/25/19 1105 96 %     Weight --      Height --      Head Circumference --      Peak Flow --      Pain  Score 06/25/19 1108 10     Pain Loc --      Pain Edu? --      Excl. in GC? --    No data found.  Updated Vital Signs BP (!) 147/88 (BP Location: Right Arm)   Pulse 89   Temp 98.7 F (37.1 C) (Oral)   Resp 16   SpO2 96%   Physical Exam Constitutional:      General: He is not in acute distress.    Appearance: He is well-developed. He is not diaphoretic.  HENT:     Head: Normocephalic and atraumatic.  Eyes:     Conjunctiva/sclera: Conjunctivae normal.     Pupils: Pupils are equal, round, and reactive to light.  Pulmonary:     Effort: Pulmonary effort is normal. No respiratory distress.  Musculoskeletal:     Comments: No swelling, erythema, warmth, contusion.  Tenderness to palpation of patellar tendon.  No other tenderness to palpation.  Full range of motion of knee.  Strength normal and equal bilaterally.  Sensation intact and equal bilaterally. No laxity.   Skin:    General: Skin is warm and dry.  Neurological:     Mental Status: He is alert and oriented to person, place, and time.      UC Treatments /  Results  Labs (all labs ordered are listed, but only abnormal results are displayed) Labs Reviewed - No data to display  EKG   Radiology No results found.  Procedures Procedures (including critical care time)  Medications Ordered in UC Medications - No data to display  Initial Impression / Assessment and Plan / UC Course  I have reviewed the triage vital signs and the nursing notes.  Pertinent labs & imaging results that were available during my care of the patient were reviewed by me and considered in my medical decision making (see chart for details).    Start Mobic, ice compress, elevation, rest, knee sleeve during activity.  Patient to follow-up with PCP for further evaluation if symptoms not improving.  Patient expresses understanding and agrees to plan.  Final Clinical Impressions(s) / UC Diagnoses   Final diagnoses:  Acute pain of right knee    ED Prescriptions    Medication Sig Dispense Auth. Provider   meloxicam (MOBIC) 7.5 MG tablet Take 1 tablet (7.5 mg total) by mouth daily. 15 tablet Threasa AlphaYu, Siriyah Ambrosius V, PA-C        Shaunak Kreis V, [place]PA-C 06/25/19 1156

## 2019-08-18 ENCOUNTER — Other Ambulatory Visit: Payer: Self-pay | Admitting: Family Medicine

## 2019-08-18 DIAGNOSIS — E559 Vitamin D deficiency, unspecified: Secondary | ICD-10-CM

## 2019-09-12 ENCOUNTER — Other Ambulatory Visit: Payer: Self-pay

## 2019-09-12 DIAGNOSIS — Z20822 Contact with and (suspected) exposure to covid-19: Secondary | ICD-10-CM

## 2019-09-13 LAB — NOVEL CORONAVIRUS, NAA: SARS-CoV-2, NAA: DETECTED — AB

## 2019-09-19 ENCOUNTER — Other Ambulatory Visit: Payer: Self-pay

## 2019-09-19 DIAGNOSIS — Z20822 Contact with and (suspected) exposure to covid-19: Secondary | ICD-10-CM

## 2019-09-20 LAB — NOVEL CORONAVIRUS, NAA: SARS-CoV-2, NAA: NOT DETECTED

## 2019-09-21 ENCOUNTER — Encounter: Payer: Self-pay | Admitting: Family Medicine

## 2019-09-21 ENCOUNTER — Ambulatory Visit: Payer: 59 | Admitting: Family Medicine

## 2019-09-21 ENCOUNTER — Other Ambulatory Visit: Payer: Self-pay

## 2019-09-21 VITALS — BP 120/84 | Temp 98.2°F | Ht 69.0 in | Wt 224.0 lb

## 2019-09-21 DIAGNOSIS — R05 Cough: Secondary | ICD-10-CM | POA: Diagnosis not present

## 2019-09-21 DIAGNOSIS — R059 Cough, unspecified: Secondary | ICD-10-CM

## 2019-09-21 DIAGNOSIS — U071 COVID-19: Secondary | ICD-10-CM | POA: Diagnosis not present

## 2019-09-21 MED ORDER — HYDROCODONE-HOMATROPINE 5-1.5 MG/5ML PO SYRP: 5.0000 mL | ORAL_SOLUTION | Freq: Three times a day (TID) | ORAL | 0 refills | Status: DC | PRN

## 2019-09-21 NOTE — Progress Notes (Signed)
Start time: 1:32 End time: 1:50  Patient's video didn't work--he could see me, but I couldn't see him.  Virtual Visit via Video Note  I connected with Annett Fabian on 09/21/19 at  1:30 PM EDT by a video enabled telemedicine application and verified that I am speaking with the correct person using two identifiers.  Location: Patient: at home, alone Provider: office   I discussed the limitations of evaluation and management by telemedicine and the availability of in person appointments. The patient expressed understanding and agreed to proceed.  History of Present Illness:  Chief Complaint  Patient presents with  . Follow-up    on COVID, still has nagging cough.    He tested positive for COVID-19 on 09/12/2019. His symptoms had started the Friday prior (10/9)--headache, head felt clogged up, tired.  Had decreased appetite, small amount of diarrhea (didn't eat much).  Never had shortness of breath.  Cough started 3 days ago, when he was feeling better.  He had a f/u test done 09/19/2019, which was negative.  He is complaining of a nagging cough.  Cough is mainly dry, sometimes he gets up a little bit of phlegm, mostly clear.  Cough is off and on throughout the day, worse at night, until he falls asleep.  It doesn't wake him up at night.  Denies heartburn.  Feels like he has slight sore throat.  Denies any runny nose, sinus pain, sniffle or sneeze.  He tried his wife's benzonatate, didn't help.  He also tried alka selzer DM and mucinex DM, helped a little.  Hasn't taken anything today.  In the past he has needed steroids to get over nagging cough after illness (per chart review, not since 2017).    PMH, PSH, SH reviewed  Outpatient Encounter Medications as of 09/21/2019  Medication Sig  . Cholecalciferol (VITAMIN D3 PO) Take 5,000 Int'l Units by mouth daily.  . [DISCONTINUED] meloxicam (MOBIC) 7.5 MG tablet Take 1 tablet (7.5 mg total) by mouth daily.  . [DISCONTINUED] Vitamin D,  Ergocalciferol, (DRISDOL) 1.25 MG (50000 UT) CAPS capsule Take 1 capsule (50,000 Units total) by mouth every 7 (seven) days.   No facility-administered encounter medications on file as of 09/21/2019.    No Known Allergies   ROS: no fever, chills, shortness of breath.  Headaches resolved.  +cough. No nausea, vomiting, diarrhea, chest pain. No rashes.  See HPI   Observations/Objective:  BP 120/84   Temp 98.2 F (36.8 C) (Temporal)   Ht 5\' 9"  (1.753 m)   Wt 224 lb (101.6 kg)   BMI 33.08 kg/m   Pleasant male, speaking easily, in full sentences, with no shortness of breath or distress.  Rare dry cough noted during the visit. No sniffling or throat-clearing. Exam is limited--his video did not work (said "connecting" the whole time) Normal mood, alert and oriented, normal speech.   Assessment and Plan:  Cough - symptomatic care reviewed.  Mucinex DM BID, hycodan syrup at night, if needed (during the day prn, risks/SE reviewed). Precautions given for f/u - Plan: HYDROcodone-homatropine (HYCODAN) 5-1.5 MG/5ML syrup  COVID-19 virus infection - symptoms resolved, except for residual cough x 3 days only.  Had f/u negative COVID test, hasn't returned to work yet    Follow Up Instructions:    I discussed the assessment and treatment plan with the patient. The patient was provided an opportunity to ask questions and all were answered. The patient agreed with the plan and demonstrated an understanding of the instructions.  The patient was advised to call back or seek an in-person evaluation if the symptoms worsen or if the condition fails to improve as anticipated.  I provided 18 minutes of non-face-to-face time during this encounter.   Lavonda Jumbo, MD

## 2019-09-21 NOTE — Patient Instructions (Addendum)
Drink plenty of water. Take Mucinex DM twice daily. Use the cough syrup that was prescribed at bedtime.  You can use it up to three times daily, if needed, for severe cough.  It can make you sleepy, shouldn't be taken if you are working or driving. Mainly plan to take this in the evening/bedtime when cough is worse.  Take claritin (or zyrtec or allegra) if you notice any nasal congestion, runny nose, postnasal drainage, as this can contribute to cough.  You will need re-evaluation if you develop any shortness of breath, chest pain, pain with breathing, fever, vomiting, discolored phlegm, persistent cough, or any other concerns.

## 2019-10-11 NOTE — Progress Notes (Signed)
Start time: 9:42 End time: 9:57  Virtual Visit via Video Note  I connected with James Townsend on 10/11/19 at  9:30 AM EST by a video enabled telemedicine application and verified that I am speaking with the correct person using two identifiers.  Location: Patient: home Provider: office   I discussed the limitations of evaluation and management by telemedicine and the availability of in person appointments. The patient expressed understanding and agreed to proceed.  History of Present Illness:  Chief Complaint  Patient presents with  . Cough    still coughing post COVID infection-not bringing much up. Mucus is clear. Sometimes when he eats still gets a bit nauseous.    Per virtual visit 09/21/2019: He tested positive for COVID-19 on 09/12/2019. His symptoms had started the Friday prior (10/9)--headache, head felt clogged up, tired.  Had decreased appetite, small amount of diarrhea (didn't eat much).  Never had shortness of breath.  Cough started 3 days ago, when he was feeling better.  He had a f/u test done 09/19/2019, which was negative.  He is complaining of a nagging cough. Cough is mainly dry, sometimes he gets up a little bit of phlegm, mostly clear. Cough is off and on throughout the day, worse at night, until he falls asleep.  It doesn't wake him up at night.  Denies heartburn.  Feels like he has slight sore throat.  Denies any runny nose, sinus pain, sniffle or sneeze.  He tried his wife's benzonatate, didn't help.  He also tried alka selzer DM and mucinex DM, helped a little.  Hasn't taken anything today.  In the past he has needed steroids to get over nagging cough after illness (per chart review, not since 2017). At visit 10/21 it was recommend that he take Mucinex DM BID, and was presribed hycodan syrup to take at night (not rx'd steroids).  UPDATE TODAY: He reports the cough never stopped.  He continues to cough a lot at night until he gets to sleep.  Has phlegm in his throat,  has a hard time getting it up.  It is small amounts, clear.  He stopped the mucinex a couple of days ago, didn't feel like it helped at all. Denies DOE/SOB. Cough is worse when he first lays down, changes to side, and then is able to get back to sleep.  Cough doesn't wake him up at night. Hycodan stopped his cough so that he could get to sleep. Denies any runny nose, itchy eyes or other allergy complaints.  PMH, PSH, SH reviewed  Outpatient Encounter Medications as of 10/12/2019  Medication Sig Note  . Cholecalciferol (VITAMIN D3 PO) Take 5,000 Int'l Units by mouth daily.   Marland Kitchen dextromethorphan-guaiFENesin (MUCINEX DM) 30-600 MG 12hr tablet Take 1 tablet by mouth 2 (two) times daily. 10/12/2019: Hasn't taken in 2 days  . HYDROcodone-homatropine (HYCODAN) 5-1.5 MG/5ML syrup Take 5 mLs by mouth every 8 (eight) hours as needed for cough. (Patient not taking: Reported on 10/12/2019)   . methylPREDNISolone (MEDROL DOSEPAK) 4 MG TBPK tablet Take as directed    No facility-administered encounter medications on file as of 10/12/2019.    (NOT taking steroid prior to visit)  No Known Allergies  ROS: no fever, chills, headaches, dizziness, chest pain.  No shortness of breath.  Some decrease in appetite, sometimes feels a little nauseated when eating, sometimes has nausea/gag related to coughing spells.  Reports he has lost 10# since having COVID. No vomiting or diarrhea, no rashes or other concerns.  Observations/Objective:  Ht 5\' 9"  (1.753 m)   Wt 224 lb (101.6 kg)   BMI 33.08 kg/m   Well-appearing, pleasant male, in good spirits. He is speaking easily, in no distress. Occasional dry cough noted during the visit. Alert and oriented, cranial nerves grossly intact. Exam limited due to virtual nature of the visit.   Assessment and Plan:  Cough - persistent since COVID. No e/o bacterial infection. Ddx reviewed. Trial oral steroids (risks/SE reviewed), antihistamines prn. f/u if persists -  Plan: methylPREDNISolone (MEDROL DOSEPAK) 4 MG TBPK tablet  History of 2019 novel coronavirus disease (COVID-19)   Take the oral steroid course as instructed. If you have any recurrent cough upon completion, try adding in an anti-histamine such as claritin, allegra or zyrtec.  If you develop any fever, chills, discolored mucus or phlegm, pain with breathing or shortness of breath, please contact 2020.  Next steps would include chest x-ray and in-office evaluation.    Follow Up Instructions:    I discussed the assessment and treatment plan with the patient. The patient was provided an opportunity to ask questions and all were answered. The patient agreed with the plan and demonstrated an understanding of the instructions.   The patient was advised to call back or seek an in-person evaluation if the symptoms worsen or if the condition fails to improve as anticipated.  I provided 15 minutes of non-face-to-face time during this encounter.   [place], MD

## 2019-10-12 ENCOUNTER — Other Ambulatory Visit: Payer: Self-pay

## 2019-10-12 ENCOUNTER — Ambulatory Visit: Payer: 59 | Admitting: Family Medicine

## 2019-10-12 ENCOUNTER — Encounter: Payer: Self-pay | Admitting: Family Medicine

## 2019-10-12 VITALS — Ht 69.0 in | Wt 224.0 lb

## 2019-10-12 DIAGNOSIS — R05 Cough: Secondary | ICD-10-CM

## 2019-10-12 DIAGNOSIS — Z8619 Personal history of other infectious and parasitic diseases: Secondary | ICD-10-CM | POA: Diagnosis not present

## 2019-10-12 DIAGNOSIS — R059 Cough, unspecified: Secondary | ICD-10-CM

## 2019-10-12 DIAGNOSIS — Z8616 Personal history of COVID-19: Secondary | ICD-10-CM

## 2019-10-12 MED ORDER — METHYLPREDNISOLONE 4 MG PO TBPK: ORAL_TABLET | ORAL | 0 refills | Status: DC

## 2019-10-12 NOTE — Patient Instructions (Signed)
  Take the oral steroid course as instructed. If you have any recurrent cough upon completion, try adding in an anti-histamine such as claritin, allegra or zyrtec.  If you develop any fever, chills, discolored mucus or phlegm, pain with breathing or shortness of breath, please contact us.  Next steps would include chest x-ray and in-office evaluation.

## 2019-10-26 ENCOUNTER — Other Ambulatory Visit: Payer: Self-pay | Admitting: Family Medicine

## 2019-10-26 DIAGNOSIS — Z8619 Personal history of other infectious and parasitic diseases: Secondary | ICD-10-CM

## 2019-10-26 DIAGNOSIS — Z8616 Personal history of COVID-19: Secondary | ICD-10-CM

## 2019-10-26 DIAGNOSIS — R059 Cough, unspecified: Secondary | ICD-10-CM

## 2019-10-26 DIAGNOSIS — R05 Cough: Secondary | ICD-10-CM

## 2019-10-28 NOTE — Progress Notes (Signed)
Chief Complaint  Patient presents with  . Cough    nagging cough-post COVID. Did not yet get CXR.     Patient presents to follow up on persistent cough, s/p COVID infection in early October.  He has had many virtual visits, last of which was 11/11. At that time, he was treated with Medrol Dosepak. He presents today with persistent cough.  Review of history: He tested positive for COVID-19 on 09/12/2019. His symptoms had started the Friday prior (10/9)--headache, head felt clogged up, tired. Had decreased appetite, small amount of diarrhea (didn't eat much). Never had shortness of breath. Cough started 3 days ago, when he was feeling better. He had a f/u test done 09/19/2019, which was negative. He is complaining of a nagging cough. Cough is mainly dry, sometimes he gets up a little bit of phlegm, mostly clear. Cough is off and on throughout the day, worse at night, until he falls asleep. It doesn't wake him up at night. Seems to be less when he is on his side. Denies heartburn. Feels like he has slight sore throat. Denies any runny nose, sinus pain, sniffle or sneeze.  Reported feeling phlegm in his throat, has a hard time getting it up, just small amounts, clear, after having stopped the mucinex a couple of days prior. Denies DOE/SOB.  Tried wife's benzonatate, tried Mucinex DM.  Given Hycodan syrup, and last treatment was the steroid pack 11/11.  UPDATE TODAY: No longer getting any phlegm.  Cough is off and on.  Sometimes it is after he eats, and when he first lays down (but not as much as in the past).  Seems to be more during the day now, not always related to eating. Denies heartburn. He reports the cough happens throughout the day. Uses cough drops, which helps temporarily.  Never resolved during the steroid course, but overall his cough has been much better overall in the last couple of days.  He took some Claritin D, which seemed to help a little, still coughed, not as  constant.  Only checks BP occasionally, mainly if he has a headache, and sometimes it is high.  Had ham over Thanksgiving, thinks that may be why it is high today.  PMH, PSH, SH reviewed  Outpatient Encounter Medications as of 10/31/2019  Medication Sig Note  . Cholecalciferol (VITAMIN D3 PO) Take 5,000 Int'l Units by mouth daily.   . [DISCONTINUED] dextromethorphan-guaiFENesin (MUCINEX DM) 30-600 MG 12hr tablet Take 1 tablet by mouth 2 (two) times daily. 10/12/2019: Hasn't taken in 2 days  . [DISCONTINUED] HYDROcodone-homatropine (HYCODAN) 5-1.5 MG/5ML syrup Take 5 mLs by mouth every 8 (eight) hours as needed for cough. (Patient not taking: Reported on 10/12/2019)   . [DISCONTINUED] methylPREDNISolone (MEDROL DOSEPAK) 4 MG TBPK tablet Take as directed    No facility-administered encounter medications on file as of 10/31/2019.    No Known Allergies  ROS: no fever, chills, headache, dizziness, chest pain, shortness of breath, GI complaints.  Nonproductive cough per HPI.  No heartburn or other GI complaints.  Possibly some congestion/allergies, as he had some improvement with claritin D.  Denies significant sinus pressure or drainage today.  PHYSICAL EXAM:  BP (!) 150/90   Pulse 80   Temp 98.6 F (37 C) (Other (Comment))   Ht 5\' 9"  (1.753 m)   Wt 234 lb 12.8 oz (106.5 kg)   BMI 34.67 kg/m     Pleasant, well-appearing male in no distress. He did not cough at all during the entire  encounter.  He is speaking easily, in full sentences, in no distress HEENT: conjunctiva and sclera are clear, EOMI.  Nasal mucos is mildly edematous bilaterally, no erythema, no purulence. no drainage noted.  Sinuses are nontender Lungs: clear bilaterally, no wheezes, rales, ronchi Heart: regular rate and rhythm Extremities: no edema  ASSESSMENT/PLAN:  Cough - nonproductive--post-viral (but didn't resolve with steroids). Consider allergies/PND and/or gerd. Claritin and prilosec trial. Check CXR  History  of 2019 novel coronavirus disease (COVID-19)    Start taking plain claritin (loraditine) 10mg  once daily. (avoid the D, since your blood pressure is high and the sudafed will raise the blood pressure). I also think it is possible that reflux could be contributing. I recommend taking Prilosec OTC once daily as well.  If your cough improves, then you stop one of the above medications, and see which one was really helping the most (might be both).  Please drink plenty of water, limit sodium in your diet, get regular exercise. Monitor your blood pressure more regularly over the next 2-4 weeks and send me your readings (through MyChart, or fax or drop them off, or mail). Goal blood pressure is under 130/80.  Up to 135/85 is borderline.  If consistently 135-140/85-90 then treatment is needed.

## 2019-10-31 ENCOUNTER — Ambulatory Visit: Payer: 59 | Admitting: Family Medicine

## 2019-10-31 ENCOUNTER — Other Ambulatory Visit: Payer: Self-pay

## 2019-10-31 ENCOUNTER — Encounter: Payer: Self-pay | Admitting: Family Medicine

## 2019-10-31 ENCOUNTER — Ambulatory Visit
Admission: RE | Admit: 2019-10-31 | Discharge: 2019-10-31 | Disposition: A | Discharge status: Home / Self Care | Payer: 59 | Source: Ambulatory Visit | Attending: Family Medicine | Admitting: Family Medicine

## 2019-10-31 VITALS — BP 150/90 | HR 80 | Temp 98.6°F | Ht 69.0 in | Wt 234.8 lb

## 2019-10-31 DIAGNOSIS — R059 Cough, unspecified: Secondary | ICD-10-CM

## 2019-10-31 DIAGNOSIS — Z8616 Personal history of COVID-19: Secondary | ICD-10-CM

## 2019-10-31 DIAGNOSIS — Z8619 Personal history of other infectious and parasitic diseases: Secondary | ICD-10-CM

## 2019-10-31 DIAGNOSIS — R05 Cough: Secondary | ICD-10-CM

## 2019-10-31 NOTE — Patient Instructions (Addendum)
Please drink plenty of water, limit sodium in your diet, get regular exercise. Monitor your blood pressure more regularly over the next 2-4 weeks and send me your readings (through MyChart, or fax or drop them off, or mail). Goal blood pressure is under 130/80.  Up to 135/85 is borderline.  If consistently 135-140/85-90 then treatment is needed.  Go to [facility] today for the chest x-ray (301 or 315).  Start taking plain claritin (loraditine) 10mg  once daily. (avoid the D, since your blood pressure is high and the sudafed will raise the blood pressure). I also think it is possible that reflux could be contributing. I recommend taking Prilosec OTC once daily as well.  If your cough improves, then you stop one of the above medications, and see which one was really helping the most (might be both).

## 2019-12-09 IMAGING — CR DG CHEST 2V
2 series · 2 of 2 positions shown · non-contrast
Comparison: 09/28/2014

CLINICAL DATA: Cough

EXAM:
CHEST - 2 VIEW

[w chest pa]
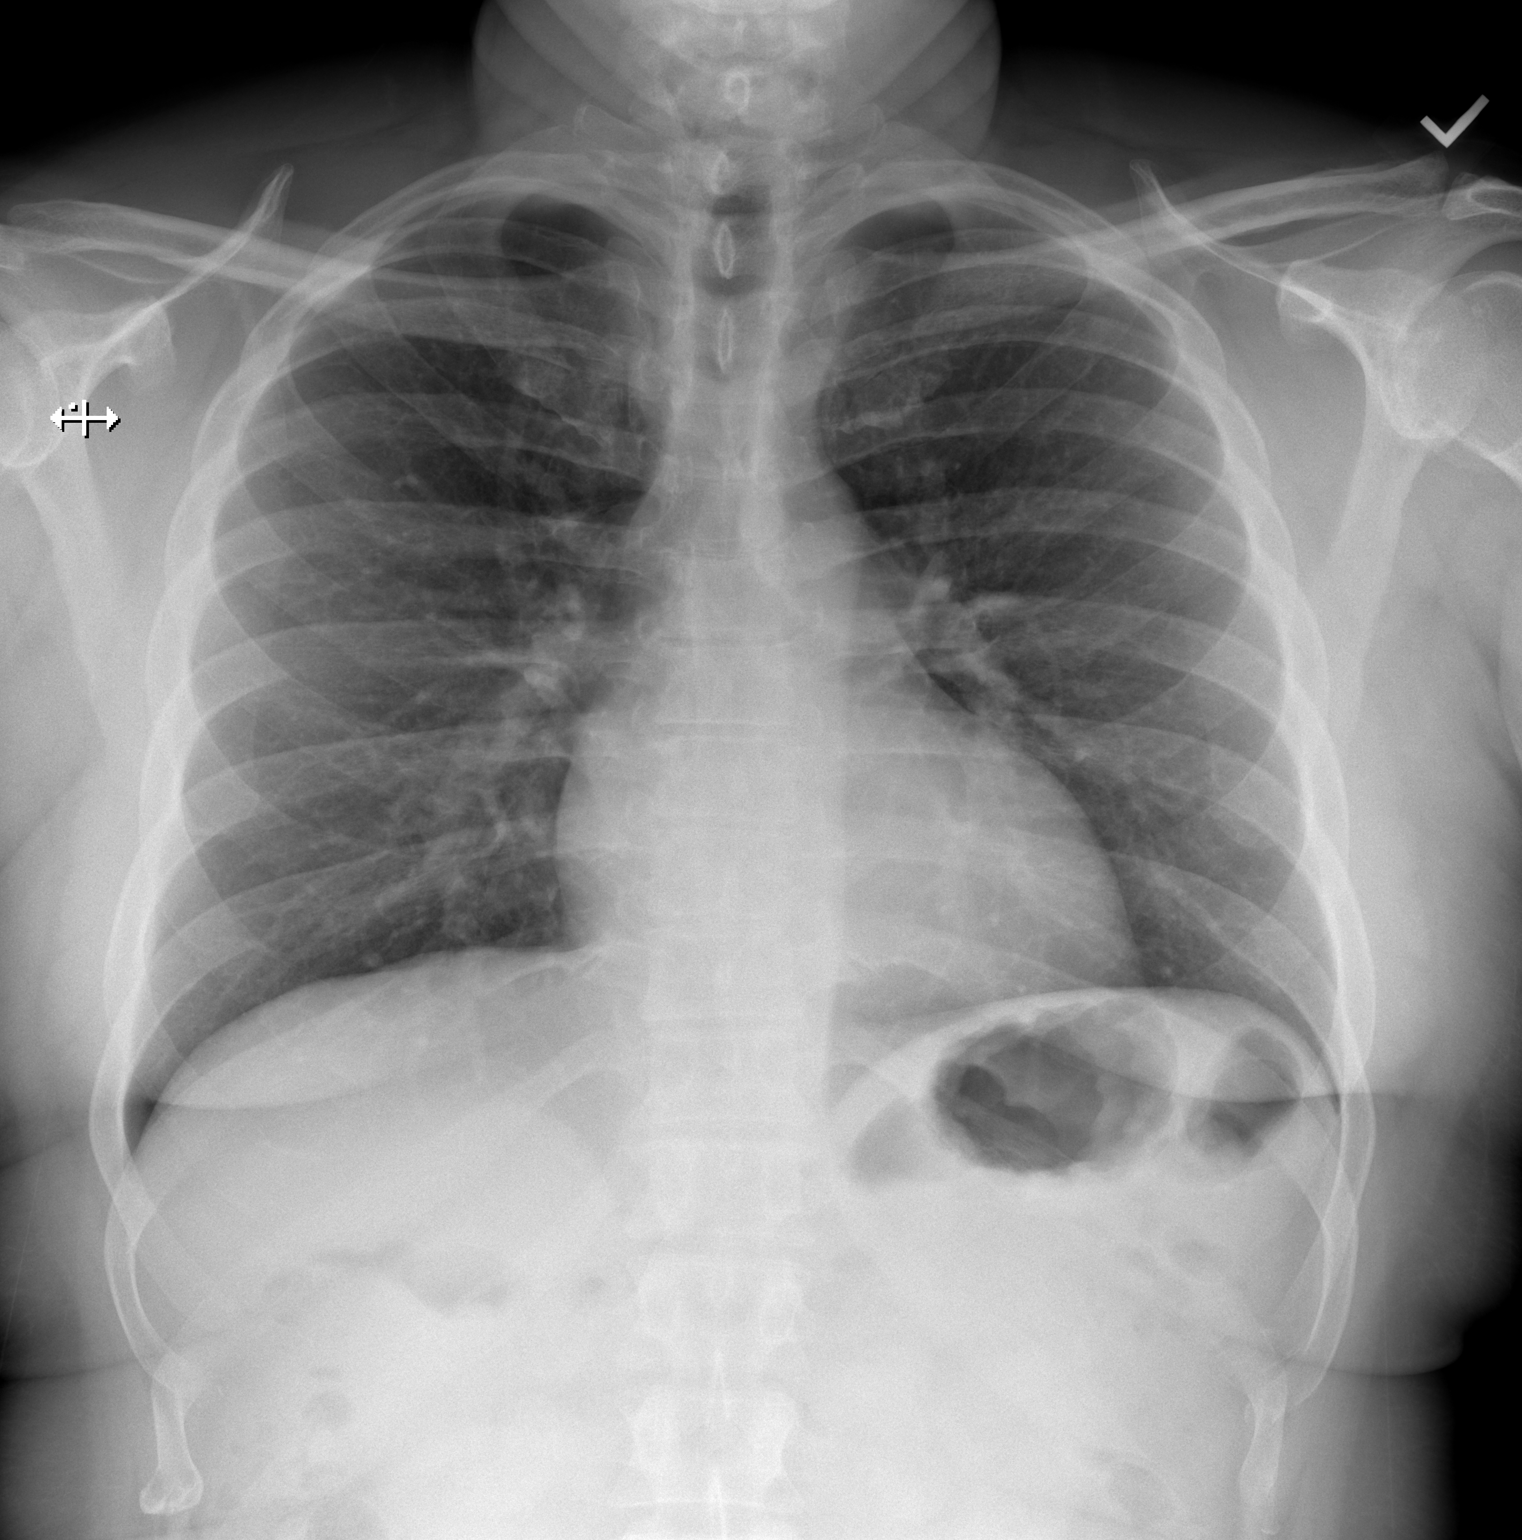

[w chest lat]
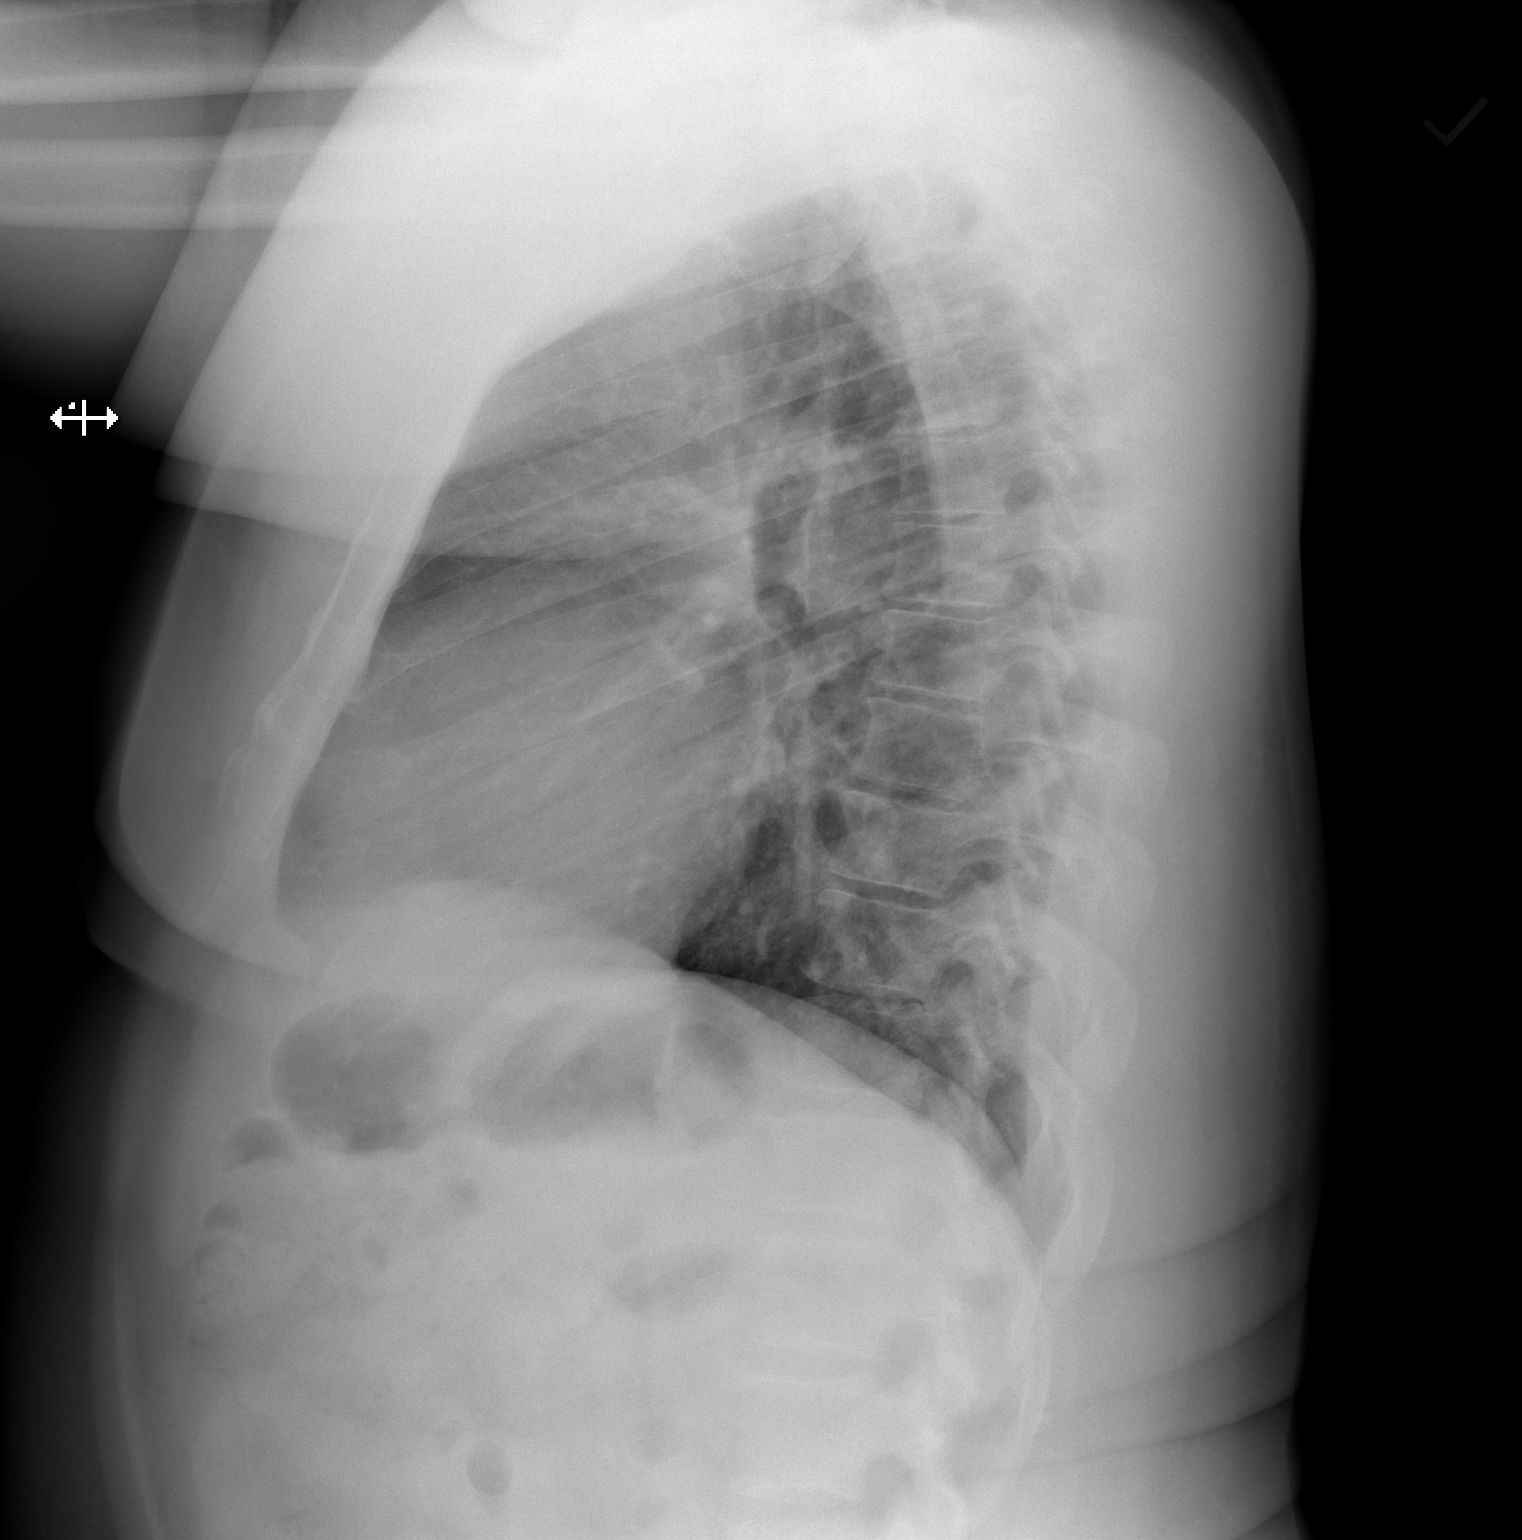

[2 of 2 positions shown; findings below may reference images not displayed]

FINDINGS: The heart size and mediastinal contours are within normal limits.
Both lungs are clear. The visualized skeletal structures are
unremarkable.
IMPRESSION: No active cardiopulmonary disease.

## 2020-03-25 ENCOUNTER — Encounter: Payer: Self-pay | Admitting: Family Medicine

## 2020-04-05 ENCOUNTER — Encounter: Payer: Self-pay | Admitting: Family Medicine

## 2020-04-05 ENCOUNTER — Ambulatory Visit: Payer: 59 | Admitting: Family Medicine

## 2020-04-05 ENCOUNTER — Other Ambulatory Visit: Payer: Self-pay

## 2020-04-05 VITALS — BP 130/90 | HR 72 | Temp 98.2°F | Ht 69.0 in | Wt 230.8 lb

## 2020-04-05 DIAGNOSIS — N410 Acute prostatitis: Secondary | ICD-10-CM

## 2020-04-05 DIAGNOSIS — N401 Enlarged prostate with lower urinary tract symptoms: Secondary | ICD-10-CM

## 2020-04-05 DIAGNOSIS — R351 Nocturia: Secondary | ICD-10-CM | POA: Diagnosis not present

## 2020-04-05 MED ORDER — TAMSULOSIN HCL 0.4 MG PO CAPS: 0.4000 mg | ORAL_CAPSULE | Freq: Every day | ORAL | 0 refills | Status: DC

## 2020-04-05 NOTE — Patient Instructions (Signed)

## 2020-04-05 NOTE — Progress Notes (Signed)
Chief Complaint  Patient presents with  . Follow-up    UC follow up. Had both COVID vaccines, forgot card-will call me with dates.    He went to UC on 4/25 and was diagnosed with prostatitis. He was given 2 shots of antibiotics (reported by patient, no UC notes available at time of visit), and put on Flomax and cipro and presents for follow-up. He was given both for a 30 day supply.  He is tolerating both without side effects.  He reports feeling better, improved after 2-3 days of medications. He wa.s told his urine was normal, and culture was negative (got a call a few days later)  He was having burning with urination, urinary frequency, fever (T102), chills. Sometimes he felt like he didn't empty bladder well, but voiding in large amounts.  Was up frequently at night to void. He had COVID test done the day prior, which was negative, which is why he went to UC.   Prior to this infection, his baseline is getting up 3-4 times/night to void. He does sip a lot of fluids at night. Since being on current medications, he is only getting up 2x/night.   PMH, PSH, SH reviewed  Outpatient Encounter Medications as of 04/05/2020  Medication Sig  . Cholecalciferol (VITAMIN D3 PO) Take 5,000 Int'l Units by mouth daily.  . ciprofloxacin (CIPRO) 500 MG tablet Take 500 mg by mouth 2 (two) times daily.  . tamsulosin (FLOMAX) 0.4 MG CAPS capsule Take 0.4 mg by mouth every morning.   No facility-administered encounter medications on file as of 04/05/2020.   No Known Allergies  ROS: fevers resolved.  No headaches, dizziness, chest pain, palpitations, URI symptoms, nausea, vomiting, diarrhea. Urinary symptoms improved, see HPI.  PHYSICAL EXAM:  BP 130/90   Pulse 72   Temp 98.2 F (36.8 C) (Temporal)   Ht 5\' 9"  (1.753 m)   Wt 230 lb 12.8 oz (104.7 kg)   BMI 34.08 kg/m   Pleasant, well-appearing male in no distress HEENT: conjunctiva and sclera are clear, EOMI. Wearing mask Neck: no lymphadenopathy,  thyromegaly or mass Heart: regular rate and rhythm Lungs: clear bilaterally Abdomen: soft, nontender, no mass Back: no CVA tenderness Rectal: normal sphincter tone.  Prostate is diffusely, moderately enlarged, slightly soft.  Nontender. No nodules or masses   ASSESSMENT/PLAN:  Prostatitis, acute - improving with Cipro  BPH associated with nocturia - nocturia has improved since on flomax and antibiotics.  Recommend continuing flomax long-term - Plan: tamsulosin (FLOMAX) 0.4 MG CAPS capsule   F/u as scheduled for CPE in 05/2020

## 2020-04-06 ENCOUNTER — Encounter: Payer: Self-pay | Admitting: Family Medicine

## 2020-04-09 ENCOUNTER — Encounter: Payer: Self-pay | Admitting: Family Medicine

## 2020-06-05 NOTE — Progress Notes (Signed)
Chief Complaint  Patient presents with  . Annual Exam    fasting annual exam. Has form to be filled out for work. Gets eye exam at work. Could not give UA yet. Toe numbness is still happening.    James Townsend is a 49 y.o. male who presents for a complete physical. He brings in a form needed for work--requests lipids to be done for form.  He had an E.coli UTI in April, and was also told he had an enlarged prostate. He was treated with cipro and Flomax. His baseline had been getting up 3-4 times/night to void (reports he sips a lot of fluids at night.)  After being on the Flomax, it improved to 2x/night by his last visit, and no longer gets up to void, just maybe once occasionally.  He denies any side effects from Flomax. Denies any urinary complaints currently.  Last year he was complaining of fatigue, feeling drained about 10-15 minutes after eating (not after fruit or vegetables, but with heavier meals, meats, unsure if relates to intake of carbs). He also reported not sleeping well at night, sometimes snoring.  He wasn't feeling well rested, didn't think he had good quality of sleep (partly interrupted by nocturia). Last year we had discussed possibly doing a sleep study--he was going to discuss with his wife. He reports that the fatigue after eating has completely resolved. He reports his wife has not noted any pauses in his breathing. He wakes up feeling refreshed most days.  He has h/o vitamin D deficiency, which may have also contributed to his fatigue.  He had a level of 12 in 05/2017.  Last check was 13.3 in 06/2019.  He hadn't been taking supplements at that time. He is currently taking 2000 IU of D3 every day.  H/o elevated BP's in the past. He monitors it at home, runs in the low 130's/80's. Denies headaches.  He tries to limit his sodium, but hasn't been as good in the last 2 weeks.  h/o hypogonadism. Last check was in 07/2015, low at 145. Previously treated with Axiron, then changed to  Androgel for insurance reasons. This low level was while on treatment. He had one testosterone cypionate injection, did not return for subsequent injections (or labs). Note in chart states that it was expensive and caused decreased libido so it was stopped. At that time he declined further therapy, and weight loss was recommended.  He currently denies decreased libido, ED or fatigue.  Prediabetes--A1c was noted to be 5.9 last year, though fasting glucose was normal.  He is due for recheck. He cut back on bread, has some wraps for lunch at work.  +regular soda (up to 2/day), occasional sweet tea (less than in the past). Avoids sweets.  Lab Results  Component Value Date   HGBA1C 5.9 (H) 06/01/2019    Immunization History  Administered Date(s) Administered  . Influenza-Unspecified 09/07/2017  . PFIZER SARS-COV-2 Vaccination 02/11/2020, 03/06/2020  . PPD Test 09/04/2014  . Tdap 06/01/2019   Last colonoscopy: once in his 20's (normal) Last PSA:  Lab Results  Component Value Date   PSA1 0.7 06/01/2019   PSA 0.60 07/11/2015   PSA 0.57 12/14/2014   PSA 0.62 12/02/2013  Dentist: every 6 months Ophtho: every year Exercise: Walks 3 miles three times/week, plus walks on the job (not aerobic). Has weights at home, hasn't been using them recently. Does a lot of pushing/pulling and lifting on the job. Lipid: Lab Results  Component Value Date  CHOL 159 06/01/2019   HDL 63 06/01/2019   LDLCALC 83 06/01/2019   TRIG 64 06/01/2019   CHOLHDL 2.5 06/01/2019    PMH, PSH, SH and FH were reviewed and updated  Outpatient Encounter Medications as of 06/07/2020  Medication Sig Note  . Cholecalciferol (VITAMIN D3 PO) Take 5,000 Int'l Units by mouth daily. 06/07/2020: Pt reports that his dosage is 2000 IU daily  . tamsulosin (FLOMAX) 0.4 MG CAPS capsule Take 1 capsule (0.4 mg total) by mouth daily.   . [DISCONTINUED] ciprofloxacin (CIPRO) 500 MG tablet Take 500 mg by mouth 2 (two) times daily.    No  facility-administered encounter medications on file as of 06/07/2020.   No Known Allergies  ROS: The patient denies anorexia, headaches, vision loss, decreased hearing, ear pain, hoarseness, chest pain, palpitations, dizziness, syncope, dyspnea on exertion, swelling, nausea, vomiting, diarrhea, constipation, abdominal pain, melena, hematochezia, indigestion/heartburn, hematuria, incontinence, weakened urine stream, dysuria, genital lesions, joint pains, numbness, tingling, weakness, tremor, suspicious skin lesions, depression, anxiety, abnormal bleeding/bruising, or enlarged lymph nodes. Denies ED or decreased libido. Denies fatigue Nocturia--resolved with Flomax Occasional discomfort at his right lateral knee (when turns/twists while walking).   PHYSICAL EXAM:  BP 132/88   Pulse 68   Ht 5\' 8"  (1.727 m)   Wt 229 lb (103.9 kg)   BMI 34.82 kg/m   Wt Readings from Last 3 Encounters:  06/07/20 229 lb (103.9 kg)  04/05/20 230 lb 12.8 oz (104.7 kg)  10/31/19 234 lb 12.8 oz (106.5 kg)   140/90 on repeat by MD  General Appearance:  Alert, cooperative, no distress, appears stated age.  Head:  Normocephalic, without obvious abnormality, atraumatic  Eyes:  PERRL, conjunctiva clear, EOM's intact, fundi benign. Pterygium medially at the left eye, unchanged  Ears:  Normal TM's and external ear canals  Nose: Not examined (wearing mask due to COVID-19 pandemic)  Throat: Not examined (wearing mask due to COVID-19 pandemic)  Neck: Supple, no lymphadenopathy; thyroid: no enlargement/ tenderness/nodules; no carotidbruit or JVD  Back:  Spine nontender, no curvature, ROM normal, no CVA tenderness  Lungs:  Clear bilaterally.Good air movement, without wheezes or ronchi; respirations unlabored  Chest Wall:  No tenderness or deformity  Heart:  Regular rate and rhythm, S1 and S2 normal, no murmur, rub or gallop  Breast Exam:  No chest wall tenderness, masses or  gynecomastia  Abdomen:  Soft, non-tender, nondistended, normoactive bowel sounds,  no masses, no hepatosplenomegaly  Genitalia:  Normal male external genitalia without lesions. Testicles without masses, normal size. No inguinal hernias.  Rectal:  Normal sphincter tone, no masses or tenderness; guaiac negative stool. Prostate smooth, no nodules, just mildly enlarged.  Extremities: No clubbing, cyanosis or edema  Pulses: 2+ and symmetric all extremities  Skin: Skin color, texture, turgor normal, no rashes or lesions  Lymph nodes: Cervical, supraclavicular, and axillary nodes normal  Neurologic: CNII-XII intact, normal strength, sensation and gait; reflexes are somewhat diminished, symmetric throughout  Psych: Normal mood, affect, hygiene and grooming.   ASSESSMENT/PLAN:  Annual physical exam - Plan: Lipid panel, Comprehensive metabolic panel, CBC with Differential/Platelet, VITAMIN D 25 Hydroxy (Vit-D Deficiency, Fractures), Hemoglobin A1c  BPH associated with nocturia - improved, continue flomax  Male hypogonadism - no longer symptomatic (therefore will not check T levels)  Vitamin D deficiency - on 2000 IU daily, due for recheck - Plan: VITAMIN D 25 Hydroxy (Vit-D Deficiency, Fractures)  Colon cancer screening - Plan: Ambulatory referral to Gastroenterology  Prediabetes - reviewed proper diet, daily  exercise. Wt loss recommended - Plan: Comprehensive metabolic panel, Hemoglobin A1c  Shown IT band stretches   Discussed PSA screening (risks/benefits), recommended at least 30 minutes of aerobic activity at least 5 days/week, weight-bearing exercise at least 2x/wk; proper sunscreen use reviewed; healthy diet and alcohol recommendations (less than or equal to 2 drinks/day) reviewed; regular seatbelt use; changing batteries in smoke detectors.  Immunization recommendations discussed--Yearly flu shots are recommended. Colonoscopy  recommendations reviewed, due now. Referred to GI  Consider DEXA at age 34 (sooner if he checks with insurance and is covered, based on diagnosis of vit D deficiency and hypogonadism)

## 2020-06-05 NOTE — Patient Instructions (Addendum)
HEALTH MAINTENANCE RECOMMENDATIONS:  It is recommended that you get at least 30 minutes of aerobic exercise at least 5 days/week (for weight loss, you may need as much as 60-90 minutes). This can be any activity that gets your heart rate up. This can be divided in 10-15 minute intervals if needed, but try and build up your endurance at least once a week.  Weight bearing exercise is also recommended twice weekly.  Eat a healthy diet with lots of vegetables, fruits and fiber.  "Colorful" foods have a lot of vitamins (ie green vegetables, tomatoes, red peppers, etc).  Limit sweet tea, regular sodas and alcoholic beverages, all of which has a lot of calories and sugar.  Up to 2 alcoholic drinks daily may be beneficial for men (unless trying to lose weight, watch sugars).  Drink a lot of water.  Sunscreen of at least SPF 30 should be used on all sun-exposed parts of the skin when outside between the hours of 10 am and 4 pm (not just when at beach or pool, but even with exercise, golf, tennis, and yard work!)  Use a sunscreen that says "broad spectrum" so it covers both UVA and UVB rays, and make sure to reapply every 1-2 hours.  Remember to change the batteries in your smoke detectors when changing your clock times in the spring and fall. Carbon monoxide detectors are recommended for your home.  Use your seat belt every time you are in a car, and please drive safely and not be distracted with cell phones and texting while driving.  Continue to try and limit the salt in your diet.  Daily exercise and weight loss will also help keep the blood pressure down.  Goal blood pressure is under 130/80.  Yours sound somewhat borderline at home, so continue to monitor and try and limit the salt and lose weight.  Please cut out regular soda (has a lot of sugar and calories). Drink more water.  If you want soda, please make it diet.  Avoid sweet tea.  Please be sure to get your flu shot in the Fall  (sept/october)--either through a nurse visit at our office, or wherever you prefer (pharmacy, church).  We are referring you to [facility] for your routine screening colonoscopy.  If you change your mind and decide on a different doctor, let us know.  You mentioned preferring getting your medications 90 day supply through the mail.  Let us know which is your preferred (by your insurance--ie [facility] vs [facility]) mail order pharmacy and we will send ina year supply of the Flomax.    DASH Eating Plan DASH stands for "Dietary Approaches to Stop Hypertension." The DASH eating plan is a healthy eating plan that has been shown to reduce high blood pressure (hypertension). It may also reduce your risk for type 2 diabetes, heart disease, and stroke. The DASH eating plan may also help with weight loss. What are tips for following this plan?  General guidelines  Avoid eating more than 2,300 mg (milligrams) of salt (sodium) a day. If you have hypertension, you may need to reduce your sodium intake to 1,500 mg a day.  Limit alcohol intake to no more than 1 drink a day for nonpregnant women and 2 drinks a day for men. One drink equals 12 oz of beer, 5 oz of wine, or 1 oz of hard liquor.  Work with your health care provider to maintain a healthy body weight or to lose weight. Ask what  an ideal weight is for you.  Get at least 30 minutes of exercise that causes your heart to beat faster (aerobic exercise) most days of the week. Activities may include walking, swimming, or biking.  Work with your health care provider or diet and nutrition specialist (dietitian) to adjust your eating plan to your individual calorie needs. Reading food labels   Check food labels for the amount of sodium per serving. Choose foods with less than 5 percent of the Daily Value of sodium. Generally, foods with less than 300 mg of sodium per serving fit into this eating plan.  To find whole grains, look for the word  "whole" as the first word in the ingredient list. Shopping  Buy products labeled as "low-sodium" or "no salt added."  Buy fresh foods. Avoid canned foods and premade or frozen meals. Cooking  Avoid adding salt when cooking. Use salt-free seasonings or herbs instead of table salt or sea salt. Check with your health care provider or pharmacist before using salt substitutes.  Do not fry foods. Cook foods using healthy methods such as baking, boiling, grilling, and broiling instead.  Cook with heart-healthy oils, such as olive, canola, soybean, or sunflower oil. Meal planning  Eat a balanced diet that includes: ? 5 or more servings of fruits and vegetables each day. At each meal, try to fill half of your plate with fruits and vegetables. ? Up to 6-8 servings of whole grains each day. ? Less than 6 oz of lean meat, poultry, or fish each day. A 3-oz serving of meat is about the same size as a deck of cards. One egg equals 1 oz. ? 2 servings of low-fat dairy each day. ? A serving of nuts, seeds, or beans 5 times each week. ? Heart-healthy fats. Healthy fats called Omega-3 fatty acids are found in foods such as flaxseeds and coldwater fish, like sardines, salmon, and mackerel.  Limit how much you eat of the following: ? Canned or prepackaged foods. ? Food that is high in trans fat, such as fried foods. ? Food that is high in saturated fat, such as fatty meat. ? Sweets, desserts, sugary drinks, and other foods with added sugar. ? Full-fat dairy products.  Do not salt foods before eating.  Try to eat at least 2 vegetarian meals each week.  Eat more home-cooked food and less restaurant, buffet, and fast food.  When eating at a restaurant, ask that your food be prepared with less salt or no salt, if possible. What foods are recommended? The items listed may not be a complete list. Talk with your dietitian about what dietary choices are best for you. Grains Whole-grain or whole-wheat  bread. Whole-grain or whole-wheat pasta. Brown rice. Orpah Cobb. Bulgur. Whole-grain and low-sodium cereals. Pita bread. Low-fat, low-sodium crackers. Whole-wheat flour tortillas. Vegetables Fresh or frozen vegetables (raw, steamed, roasted, or grilled). Low-sodium or reduced-sodium tomato and vegetable juice. Low-sodium or reduced-sodium tomato sauce and tomato paste. Low-sodium or reduced-sodium canned vegetables. Fruits All fresh, dried, or frozen fruit. Canned fruit in natural juice (without added sugar). Meat and other protein foods Skinless chicken or [place]. Ground chicken or [place]. Pork with fat trimmed off. Fish and seafood. Egg whites. Dried beans, peas, or lentils. Unsalted nuts, nut butters, and seeds. Unsalted canned beans. Lean cuts of beef with fat trimmed off. Low-sodium, lean deli meat. Dairy Low-fat (1%) or fat-free (skim) milk. Fat-free, low-fat, or reduced-fat cheeses. Nonfat, low-sodium ricotta or cottage cheese. Low-fat or nonfat yogurt. Low-fat, low-sodium  cheese. Fats and oils Soft margarine without trans fats. Vegetable oil. Low-fat, reduced-fat, or light mayonnaise and salad dressings (reduced-sodium). Canola, safflower, olive, soybean, and sunflower oils. Avocado. Seasoning and other foods Herbs. Spices. Seasoning mixes without salt. Unsalted popcorn and pretzels. Fat-free sweets. What foods are not recommended? The items listed may not be a complete list. Talk with your dietitian about what dietary choices are best for you. Grains Baked goods made with fat, such as croissants, muffins, or some breads. Dry pasta or rice meal packs. Vegetables Creamed or fried vegetables. Vegetables in a cheese sauce. Regular canned vegetables (not low-sodium or reduced-sodium). Regular canned tomato sauce and paste (not low-sodium or reduced-sodium). Regular tomato and vegetable juice (not low-sodium or reduced-sodium). Rosita Fire. Olives. Fruits Canned fruit in a light or heavy  syrup. Fried fruit. Fruit in cream or butter sauce. Meat and other protein foods Fatty cuts of meat. Ribs. Fried meat. Tomasa Blase. Sausage. Bologna and other processed lunch meats. Salami. Fatback. Hotdogs. Bratwurst. Salted nuts and seeds. Canned beans with added salt. Canned or smoked fish. Whole eggs or egg yolks. Chicken or [place] with skin. Dairy Whole or 2% milk, cream, and half-and-half. Whole or full-fat cream cheese. Whole-fat or sweetened yogurt. Full-fat cheese. Nondairy creamers. Whipped toppings. Processed cheese and cheese spreads. Fats and oils Butter. Stick margarine. Lard. Shortening. Ghee. Bacon fat. Tropical oils, such as coconut, palm kernel, or palm oil. Seasoning and other foods Salted popcorn and pretzels. Onion salt, garlic salt, seasoned salt, table salt, and sea salt. Worcestershire sauce. Tartar sauce. Barbecue sauce. Teriyaki sauce. Soy sauce, including reduced-sodium. Steak sauce. Canned and packaged gravies. Fish sauce. Oyster sauce. Cocktail sauce. Horseradish that you find on the shelf. Ketchup. Mustard. Meat flavorings and tenderizers. Bouillon cubes. Hot sauce and Tabasco sauce. Premade or packaged marinades. Premade or packaged taco seasonings. Relishes. Regular salad dressings. Where to find more information:  National Heart, Lung, and Blood Institute: PopSteam.is  American Heart Association: www.heart.org Summary  The DASH eating plan is a healthy eating plan that has been shown to reduce high blood pressure (hypertension). It may also reduce your risk for type 2 diabetes, heart disease, and stroke.  With the DASH eating plan, you should limit salt (sodium) intake to 2,300 mg a day. If you have hypertension, you may need to reduce your sodium intake to 1,500 mg a day.  When on the DASH eating plan, aim to eat more fresh fruits and vegetables, whole grains, lean proteins, low-fat dairy, and heart-healthy fats.  Work with your health care provider or diet and  nutrition specialist (dietitian) to adjust your eating plan to your individual calorie needs. This information is not intended to replace advice given to you by your health care provider. Make sure you discuss any questions you have with your health care provider. Document Revised: 10/30/2017 Document Reviewed: 11/10/2016 Elsevier Patient Education  2020 [employer].

## 2020-06-07 ENCOUNTER — Encounter: Payer: Self-pay | Admitting: Family Medicine

## 2020-06-07 ENCOUNTER — Other Ambulatory Visit: Payer: Self-pay

## 2020-06-07 ENCOUNTER — Ambulatory Visit: Payer: 59 | Admitting: Family Medicine

## 2020-06-07 ENCOUNTER — Encounter: Payer: Self-pay | Admitting: Gastroenterology

## 2020-06-07 VITALS — BP 132/88 | HR 68 | Ht 68.0 in | Wt 229.0 lb

## 2020-06-07 DIAGNOSIS — Z0000 Encounter for general adult medical examination without abnormal findings: Secondary | ICD-10-CM | POA: Diagnosis not present

## 2020-06-07 DIAGNOSIS — N401 Enlarged prostate with lower urinary tract symptoms: Secondary | ICD-10-CM | POA: Diagnosis not present

## 2020-06-07 DIAGNOSIS — R7303 Prediabetes: Secondary | ICD-10-CM

## 2020-06-07 DIAGNOSIS — E559 Vitamin D deficiency, unspecified: Secondary | ICD-10-CM | POA: Diagnosis not present

## 2020-06-07 DIAGNOSIS — R351 Nocturia: Secondary | ICD-10-CM

## 2020-06-07 DIAGNOSIS — Z1211 Encounter for screening for malignant neoplasm of colon: Secondary | ICD-10-CM

## 2020-06-07 DIAGNOSIS — E291 Testicular hypofunction: Secondary | ICD-10-CM

## 2020-06-08 LAB — CBC WITH DIFFERENTIAL/PLATELET
Basophils Absolute: 0 10*3/uL (ref 0.0–0.2)
Basos: 1 %
EOS (ABSOLUTE): 0.2 10*3/uL (ref 0.0–0.4)
Eos: 4 %
Hematocrit: 41.4 % (ref 37.5–51.0)
Hemoglobin: 14 g/dL (ref 13.0–17.7)
Immature Grans (Abs): 0 10*3/uL (ref 0.0–0.1)
Immature Granulocytes: 0 %
Lymphocytes Absolute: 1.7 10*3/uL (ref 0.7–3.1)
Lymphs: 39 %
MCH: 27.9 pg (ref 26.6–33.0)
MCHC: 33.8 g/dL (ref 31.5–35.7)
MCV: 83 fL (ref 79–97)
Monocytes Absolute: 0.5 10*3/uL (ref 0.1–0.9)
Monocytes: 11 %
Neutrophils Absolute: 2 10*3/uL (ref 1.4–7.0)
Neutrophils: 45 %
Platelets: 143 10*3/uL — ABNORMAL LOW (ref 150–450)
RBC: 5.01 x10E6/uL (ref 4.14–5.80)
RDW: 13.9 % (ref 11.6–15.4)
WBC: 4.3 10*3/uL (ref 3.4–10.8)

## 2020-06-08 LAB — HEMOGLOBIN A1C
Est. average glucose Bld gHb Est-mCnc: 114 mg/dL
Hgb A1c MFr Bld: 5.6 % (ref 4.8–5.6)

## 2020-06-08 LAB — COMPREHENSIVE METABOLIC PANEL
ALT: 22 IU/L (ref 0–44)
AST: 17 IU/L (ref 0–40)
Albumin/Globulin Ratio: 1.5 (ref 1.2–2.2)
Albumin: 4.6 g/dL (ref 4.0–5.0)
Alkaline Phosphatase: 90 IU/L (ref 48–121)
BUN/Creatinine Ratio: 9 (ref 9–20)
BUN: 10 mg/dL (ref 6–24)
Bilirubin Total: 0.3 mg/dL (ref 0.0–1.2)
CO2: 27 mmol/L (ref 20–29)
Calcium: 9.1 mg/dL (ref 8.7–10.2)
Chloride: 101 mmol/L (ref 96–106)
Creatinine, Ser: 1.07 mg/dL (ref 0.76–1.27)
GFR calc Af Amer: 94 mL/min/{1.73_m2} (ref >59)
GFR calc non Af Amer: 82 mL/min/{1.73_m2} (ref >59)
Globulin, Total: 3 g/dL (ref 1.5–4.5)
Glucose: 101 mg/dL — ABNORMAL HIGH (ref 65–99)
Potassium: 4.4 mmol/L (ref 3.5–5.2)
Sodium: 140 mmol/L (ref 134–144)
Total Protein: 7.6 g/dL (ref 6.0–8.5)

## 2020-06-08 LAB — LIPID PANEL
Chol/HDL Ratio: 2.4 ratio (ref 0.0–5.0)
Cholesterol, Total: 185 mg/dL (ref 100–199)
HDL: 76 mg/dL (ref >39)
LDL Chol Calc (NIH): 98 mg/dL (ref 0–99)
Triglycerides: 59 mg/dL (ref 0–149)
VLDL Cholesterol Cal: 11 mg/dL (ref 5–40)

## 2020-06-08 LAB — VITAMIN D 25 HYDROXY (VIT D DEFICIENCY, FRACTURES): Vit D, 25-Hydroxy: 17.9 ng/mL — ABNORMAL LOW (ref 30.0–100.0)

## 2020-06-08 MED ORDER — VITAMIN D (ERGOCALCIFEROL) 1.25 MG (50000 UNIT) PO CAPS: 50000.0000 [IU] | ORAL_CAPSULE | ORAL | 0 refills | Status: DC

## 2020-07-20 ENCOUNTER — Other Ambulatory Visit: Payer: Self-pay | Admitting: Family Medicine

## 2020-07-20 DIAGNOSIS — R351 Nocturia: Secondary | ICD-10-CM

## 2020-07-20 DIAGNOSIS — N401 Enlarged prostate with lower urinary tract symptoms: Secondary | ICD-10-CM

## 2020-07-20 NOTE — Telephone Encounter (Signed)
Is this okay to refill? 

## 2020-07-27 ENCOUNTER — Other Ambulatory Visit: Payer: Self-pay

## 2020-07-27 ENCOUNTER — Ambulatory Visit (AMBULATORY_SURGERY_CENTER): Payer: Self-pay | Admitting: *Deleted

## 2020-07-27 ENCOUNTER — Encounter: Payer: Self-pay | Admitting: Gastroenterology

## 2020-07-27 VITALS — Ht 68.0 in | Wt 236.6 lb

## 2020-07-27 DIAGNOSIS — Z1211 Encounter for screening for malignant neoplasm of colon: Secondary | ICD-10-CM

## 2020-07-27 NOTE — Progress Notes (Signed)
Patient denies any allergies to egg or soy products. Patient denies complications with anesthesia/sedation.  Patient denies oxygen use at home and denies diet medications. Emmi instructions for colonoscopy explained and given to patient.  Patient had both covid vaccinations.  

## 2020-08-10 ENCOUNTER — Encounter: Payer: Self-pay | Admitting: Gastroenterology

## 2020-08-10 ENCOUNTER — Ambulatory Visit (AMBULATORY_SURGERY_CENTER): Payer: 59 | Admitting: Gastroenterology

## 2020-08-10 ENCOUNTER — Other Ambulatory Visit: Payer: Self-pay

## 2020-08-10 VITALS — BP 136/91 | HR 62 | Temp 98.6°F | Resp 18 | Ht 68.0 in | Wt 236.6 lb

## 2020-08-10 DIAGNOSIS — Z1211 Encounter for screening for malignant neoplasm of colon: Secondary | ICD-10-CM

## 2020-08-10 MED ORDER — SODIUM CHLORIDE 0.9 % IV SOLN: 500.0000 mL | Freq: Once | INTRAVENOUS | Status: DC

## 2020-08-10 NOTE — Patient Instructions (Signed)
Resume previous diet and medications. ° °Repeat colonoscopy in 10 years. ° ° °YOU HAD AN ENDOSCOPIC PROCEDURE TODAY AT THE  ENDOSCOPY CENTER:   Refer to the procedure report that was given to you for any specific questions about what was found during the examination.  If the procedure report does not answer your questions, please call your gastroenterologist to clarify.  If you requested that your care partner not be given the details of your procedure findings, then the procedure report has been included in a sealed envelope for you to review at your convenience later. ° °YOU SHOULD EXPECT: Some feelings of bloating in the abdomen. Passage of more gas than usual.  Walking can help get rid of the air that was put into your GI tract during the procedure and reduce the bloating. If you had a lower endoscopy (such as a colonoscopy or flexible sigmoidoscopy) you may notice spotting of blood in your stool or on the toilet paper. If you underwent a bowel prep for your procedure, you may not have a normal bowel movement for a few days. ° °Please Note:  You might notice some irritation and congestion in your nose or some drainage.  This is from the oxygen used during your procedure.  There is no need for concern and it should clear up in a day or so. ° °SYMPTOMS TO REPORT IMMEDIATELY: ° °Following lower endoscopy (colonoscopy or flexible sigmoidoscopy): ° Excessive amounts of blood in the stool ° Significant tenderness or worsening of abdominal pains ° Swelling of the abdomen that is new, acute ° Fever of 100°F or higher ° ° °For urgent or emergent issues, a gastroenterologist can be reached at any hour by calling [phone]. °Do not use MyChart messaging for urgent concerns.  ° ° °DIET:  We do recommend a small meal at first, but then you may proceed to your regular diet.  Drink plenty of fluids but you should avoid alcoholic beverages for 24 hours. ° °ACTIVITY:  You should plan to take it easy for the rest of  today and you should NOT DRIVE or use heavy machinery until tomorrow (because of the sedation medicines used during the test).   ° °FOLLOW UP: °Our staff will call the number listed on your records 48-72 hours following your procedure to check on you and address any questions or concerns that you may have regarding the information given to you following your procedure. If we do not reach you, we will leave a message.  We will attempt to reach you two times.  During this call, we will ask if you have developed any symptoms of COVID 19. If you develop any symptoms (ie: fever, flu-like symptoms, shortness of breath, cough etc.) before then, please call [phone].  If you test positive for Covid 19 in the 2 weeks post procedure, please call and report this information to us.   ° °If any biopsies were taken you will be contacted by phone or by letter within the next 1-3 weeks.  Please call us at [phone] if you have not heard about the biopsies in 3 weeks.  ° ° °SIGNATURES/CONFIDENTIALITY: °You and/or your care partner have signed paperwork which will be entered into your electronic medical record.  These signatures attest to the fact that that the information above on your After Visit Summary has been reviewed and is understood.  Full responsibility of the confidentiality of this discharge information lies with you and/or your care-partner.  °

## 2020-08-10 NOTE — Progress Notes (Signed)
Pt Drowsy. VSS. To PACU, report to RN. No anesthetic complications noted.  

## 2020-08-10 NOTE — Progress Notes (Signed)
Pt's states no medical or surgical changes since previsit or office visit. 

## 2020-08-10 NOTE — Op Note (Signed)
[facility] Patient Name: James Townsend Procedure Date: 08/10/2020 7:25 AM MRN: [id number] Endoscopist: Rachael Fee , MD Age: 49 Referring MD:  Date of Birth: [birth year] Gender: Male Account #: [id number] Procedure:                Colonoscopy Indications:              Screening for colorectal malignant neoplasm Medicines:                Monitored Anesthesia Care Procedure:                Pre-Anesthesia Assessment:                           - Prior to the procedure, a History and Physical                            was performed, and patient medications and                            allergies were reviewed. The patient's tolerance of                            previous anesthesia was also reviewed. The risks                            and benefits of the procedure and the sedation                            options and risks were discussed with the patient.                            All questions were answered, and informed consent                            was obtained. Prior Anticoagulants: The patient has                            taken no previous anticoagulant or antiplatelet                            agents. ASA Grade Assessment: II - A patient with                            mild systemic disease. After reviewing the risks                            and benefits, the patient was deemed in                            satisfactory condition to undergo the procedure.                           After obtaining informed consent, the colonoscope  was passed under direct vision. Throughout the                            procedure, the patient's blood pressure, pulse, and                            oxygen saturations were monitored continuously. The                            Colonoscope was introduced through the anus and                            advanced to the the cecum, identified by                            appendiceal orifice and  ileocecal valve. The                            colonoscopy was performed without difficulty. The                            patient tolerated the procedure well. The quality                            of the bowel preparation was excellent. Scope In: 8:08:19 AM Scope Out: 8:23:48 AM Scope Withdrawal Time: 0 hours 10 minutes 35 seconds  Total Procedure Duration: 0 hours 15 minutes 29 seconds  Findings:                 The entire examined colon appeared normal on direct                            and retroflexion views. Complications:            No immediate complications. Estimated blood loss:                            None. Estimated Blood Loss:     Estimated blood loss: none. Impression:               - The entire examined colon is normal on direct and                            retroflexion views.                           - No polyps or cancers.. Recommendation:           - Patient has a contact number available for                            emergencies. The signs and symptoms of potential                            delayed complications were discussed with the  patient. Return to normal activities tomorrow.                            Written discharge instructions were provided to the                            patient.                           - Resume previous diet.                           - Continue present medications.                           - Repeat colonoscopy in 10 years for screening. Rachael Fee, MD 08/10/2020 8:30:25 AM This report has been signed electronically.

## 2020-08-14 ENCOUNTER — Telehealth: Payer: Self-pay

## 2020-08-14 NOTE — Telephone Encounter (Signed)
  Follow up Call-  Call back number 08/10/2020  Post procedure Call Back phone  # [phone]  Permission to leave phone message Yes  Some recent data might be hidden     Patient questions:  Do you have a fever, pain , or abdominal swelling? No. Pain Score  0 *  Have you tolerated food without any problems? Yes.    Have you been able to return to your normal activities? Yes.    Do you have any questions about your discharge instructions: Diet   No. Medications  No. Follow up visit  No.  Do you have questions or concerns about your Care? No.  Actions: * If pain score is 4 or above: No action needed, pain <4.  1. Have you developed a fever since your procedure? no  2.   Have you had an respiratory symptoms (SOB or cough) since your procedure? no  3.   Have you tested positive for COVID 19 since your procedure no  4.   Have you had any family members/close contacts diagnosed with the COVID 19 since your procedure?  no   If yes to any of these questions please route to Laverna Peace, RN and Karlton Lemon, RN

## 2020-08-28 ENCOUNTER — Encounter: Payer: Self-pay | Admitting: Family Medicine

## 2020-08-29 NOTE — Progress Notes (Signed)
Chief Complaint  Patient presents with  . Hypertension    has been checking bp at home and getting elevated readings. Having headaches and some lightheadness-just feels a little bit off.    Patient presents to discuss elevated blood pressures.  He reports he started having constant headaches last week. BP was 160/110. He monitored it more regularly, twice daily, and still ranging 145-160/100's.  At his physical in July he had reported home BP's running in the low 130's/80's.  BP Readings from Last 3 Encounters:  08/10/20 (!) 136/91  06/07/20 132/88  04/05/20 130/90   He doesn't have a headache today.  Describes it has a heaviness on the back of the head when he has it.    He had colonoscopy on 9/10 (gatorade/Miralax prep), normal.  He hasn't had any gatorade since.  +tortilla chips ("Scoops"), some [employer] in his diet, though tried cutting it back recently since BP's are high.  Uses low sodium broth when he makes his soups, no canned foods.  Vitamin D level was last 17.9 in 05/2020.  He is currently still taking the prescription vitamin D once weekly.   PMH, PSH, SH reviewed  Outpatient Encounter Medications as of 08/30/2020  Medication Sig Note  . tamsulosin (FLOMAX) 0.4 MG CAPS capsule TAKE 1 CAPSULE BY MOUTH EVERY DAY   . Vitamin D, Ergocalciferol, (DRISDOL) 1.25 MG (50000 UNIT) CAPS capsule Take 1 capsule (50,000 Units total) by mouth every 7 (seven) days.   . Cholecalciferol (VITAMIN D3 PO) Take 5,000 Int'l Units by mouth daily. (Patient not taking: Reported on 08/30/2020) 06/07/2020: Pt reports that his dosage is 2000 IU daily  . hydrochlorothiazide (HYDRODIURIL) 25 MG tablet Take 1/2 tablet by mouth every morning.  Increase to full tablet after 2 weeks if BP is over 135-140/85-90   . [DISCONTINUED] bisacodyl (BISACODYL) 5 MG EC tablet Take 5 mg by mouth daily as needed for moderate constipation. Dulcolax 5 mg tab take as directed for colonoscopy prep.   . [DISCONTINUED] Polyethylene  Glycol 3350 (MIRALAX PO) Take by mouth. Miralax 238 grams as directed for colonoscopy prep.    No facility-administered encounter medications on file as of 08/30/2020.   (NOT taking HCTZ prior to today's visit)  Allergies  Allergen Reactions  . Shrimp [Shellfish Allergy] Itching    Itching and redness on body   ROS: no fever, chills, URI symptoms, dizziness, chest pain, shortness of breath. Headaches per HPI. Denies snoring, apnea, unrefreshed sleep (on days when he can sleep in, he feels well rested.).   PHYSICAL EXAM:  BP (!) 140/100   Pulse 80   Ht 5\' 8"  (1.727 m)   Wt 232 lb 12.8 oz (105.6 kg)   BMI 35.40 kg/m   134/90 on repeat by MD  Wt Readings from Last 3 Encounters:  08/30/20 232 lb 12.8 oz (105.6 kg)  08/10/20 236 lb 9.6 oz (107.3 kg)  07/27/20 236 lb 9.6 oz (107.3 kg)   Pleasant, well-appearing male, in no distress HEENT: conjunctiva and sclera are clear, EOMI, wearing mask Neck: no lymphadenopathy or mass Heart: regular rate and rhythm, no murmur or ectopy Lungs: clear bilaterally Abdomen: soft, nontender, no mass Extremities: no edema.  ASSESSMENT/PLAN:  Essential hypertension - reviewed low Na diet, rec daily exercise, wt loss. Start HCTZ 12.5, increase if not at goal. Monitor BP's. K+-rich foods reviewed - Plan: hydrochlorothiazide (HYDRODIURIL) 25 MG tablet  Vitamin D deficiency - reminded of OTC recommendations when he completes the rx  Need for influenza vaccination -  Plan: Flu Vaccine QUAD 6+ mos PF IM (Fluarix Quad PF)    F/u 4-6 weeks  Vitamin D deficiency--once you complete your weekly prescription, please start taking 4000-5000 IU of over-the-counter D3 daily (if you have 2000 IU capsules, take 2 until you run out, then buy the 5000 IU dose at the store and take 1 daily, and continue it longterm--buy a large bottle).  Start HCTZ at 1/2 tablet daily, in the morning. After 2 weeks, if your blood pressure is still over 135-140/85-90, then  increase it to the full tablet. Be sure to stay well hydrated, and get potassium in your diet (ie 1/2-1 banana daily).  Bring in your list of blood pressures and your blood pressure monitor to your follow-up visit.  On your list of BP's, record if you had headache, dizziness, when you increased the dose (if you needed to), and any thing that you think may contribute to higher or lower blood pressure (diet, poor sleep, missed pills, exercise).

## 2020-08-30 ENCOUNTER — Encounter: Payer: Self-pay | Admitting: Family Medicine

## 2020-08-30 ENCOUNTER — Other Ambulatory Visit: Payer: Self-pay

## 2020-08-30 ENCOUNTER — Ambulatory Visit (INDEPENDENT_AMBULATORY_CARE_PROVIDER_SITE_OTHER): Payer: 59 | Admitting: Family Medicine

## 2020-08-30 VITALS — BP 134/90 | HR 80 | Ht 68.0 in | Wt 232.8 lb

## 2020-08-30 DIAGNOSIS — Z23 Encounter for immunization: Secondary | ICD-10-CM | POA: Diagnosis not present

## 2020-08-30 DIAGNOSIS — E559 Vitamin D deficiency, unspecified: Secondary | ICD-10-CM

## 2020-08-30 DIAGNOSIS — I10 Essential (primary) hypertension: Secondary | ICD-10-CM

## 2020-08-30 MED ORDER — HYDROCHLOROTHIAZIDE 25 MG PO TABS: ORAL_TABLET | ORAL | 1 refills | Status: DC

## 2020-08-30 NOTE — Patient Instructions (Signed)
Vitamin D deficiency--once you complete your weekly prescription, please start taking 4000-5000 IU of over-the-counter D3 daily (if you have 2000 IU capsules, take 2 until you run out, then buy the 5000 IU dose at the store and take 1 daily, and continue it longterm--buy a large bottle).  Start HCTZ at 1/2 tablet daily, in the morning. After 2 weeks, if your blood pressure is still over 135-140/85-90, then increase it to the full tablet. Be sure to stay well hydrated, and get potassium in your diet (ie 1/2-1 banana daily).  Bring in your list of blood pressures and your blood pressure monitor to your follow-up visit.  On your list of BP's, record if you had headache, dizziness, when you increased the dose (if you needed to), and any thing that you think may contribute to higher or lower blood pressure (diet, poor sleep, missed pills, exercise).    DASH Eating Plan DASH stands for "Dietary Approaches to Stop Hypertension." The DASH eating plan is a healthy eating plan that has been shown to reduce high blood pressure (hypertension). It may also reduce your risk for type 2 diabetes, heart disease, and stroke. The DASH eating plan may also help with weight loss. What are tips for following this plan?  General guidelines  Avoid eating more than 2,300 mg (milligrams) of salt (sodium) a day. If you have hypertension, you may need to reduce your sodium intake to 1,500 mg a day.  Limit alcohol intake to no more than 1 drink a day for nonpregnant women and 2 drinks a day for men. One drink equals 12 oz of beer, 5 oz of wine, or 1 oz of hard liquor.  Work with your health care provider to maintain a healthy body weight or to lose weight. Ask what an ideal weight is for you.  Get at least 30 minutes of exercise that causes your heart to beat faster (aerobic exercise) most days of the week. Activities may include walking, swimming, or biking.  Work with your health care provider or diet and nutrition  specialist (dietitian) to adjust your eating plan to your individual calorie needs. Reading food labels   Check food labels for the amount of sodium per serving. Choose foods with less than 5 percent of the Daily Value of sodium. Generally, foods with less than 300 mg of sodium per serving fit into this eating plan.  To find whole grains, look for the word "whole" as the first word in the ingredient list. Shopping  Buy products labeled as "low-sodium" or "no salt added."  Buy fresh foods. Avoid canned foods and premade or frozen meals. Cooking  Avoid adding salt when cooking. Use salt-free seasonings or herbs instead of table salt or sea salt. Check with your health care provider or pharmacist before using salt substitutes.  Do not fry foods. Cook foods using healthy methods such as baking, boiling, grilling, and broiling instead.  Cook with heart-healthy oils, such as olive, canola, soybean, or sunflower oil. Meal planning  Eat a balanced diet that includes: ? 5 or more servings of fruits and vegetables each day. At each meal, try to fill half of your plate with fruits and vegetables. ? Up to 6-8 servings of whole grains each day. ? Less than 6 oz of lean meat, poultry, or fish each day. A 3-oz serving of meat is about the same size as a deck of cards. One egg equals 1 oz. ? 2 servings of low-fat dairy each day. ? A serving  of nuts, seeds, or beans 5 times each week. ? Heart-healthy fats. Healthy fats called Omega-3 fatty acids are found in foods such as flaxseeds and coldwater fish, like sardines, salmon, and mackerel.  Limit how much you eat of the following: ? Canned or prepackaged foods. ? Food that is high in trans fat, such as fried foods. ? Food that is high in saturated fat, such as fatty meat. ? Sweets, desserts, sugary drinks, and other foods with added sugar. ? Full-fat dairy products.  Do not salt foods before eating.  Try to eat at least 2 vegetarian meals each  week.  Eat more home-cooked food and less restaurant, buffet, and fast food.  When eating at a restaurant, ask that your food be prepared with less salt or no salt, if possible. What foods are recommended? The items listed may not be a complete list. Talk with your dietitian about what dietary choices are best for you. Grains Whole-grain or whole-wheat bread. Whole-grain or whole-wheat pasta. Brown rice. [place]. Bulgur. Whole-grain and low-sodium cereals. Pita bread. Low-fat, low-sodium crackers. Whole-wheat flour tortillas. Vegetables Fresh or frozen vegetables (raw, steamed, roasted, or grilled). Low-sodium or reduced-sodium tomato and vegetable juice. Low-sodium or reduced-sodium tomato sauce and tomato paste. Low-sodium or reduced-sodium canned vegetables. Fruits All fresh, dried, or frozen fruit. Canned fruit in natural juice (without added sugar). Meat and other protein foods Skinless chicken or [place]. Ground chicken or [place]. Pork with fat trimmed off. Fish and seafood. Egg whites. Dried beans, peas, or lentils. Unsalted nuts, nut butters, and seeds. Unsalted canned beans. Lean cuts of beef with fat trimmed off. Low-sodium, lean deli meat. Dairy Low-fat (1%) or fat-free (skim) milk. Fat-free, low-fat, or reduced-fat cheeses. Nonfat, low-sodium ricotta or cottage cheese. Low-fat or nonfat yogurt. Low-fat, low-sodium cheese. Fats and oils Soft margarine without trans fats. Vegetable oil. Low-fat, reduced-fat, or light mayonnaise and salad dressings (reduced-sodium). Canola, safflower, olive, soybean, and sunflower oils. Avocado. Seasoning and other foods Herbs. Spices. Seasoning mixes without salt. Unsalted popcorn and pretzels. Fat-free sweets. What foods are not recommended? The items listed may not be a complete list. Talk with your dietitian about what dietary choices are best for you. Grains Baked goods made with fat, such as croissants, muffins, or some breads. Dry pasta  or rice meal packs. Vegetables Creamed or fried vegetables. Vegetables in a cheese sauce. Regular canned vegetables (not low-sodium or reduced-sodium). Regular canned tomato sauce and paste (not low-sodium or reduced-sodium). Regular tomato and vegetable juice (not low-sodium or reduced-sodium). Angie Fava. Olives. Fruits Canned fruit in a light or heavy syrup. Fried fruit. Fruit in cream or butter sauce. Meat and other protein foods Fatty cuts of meat. Ribs. Fried meat. Berniece Salines. Sausage. Bologna and other processed lunch meats. Salami. Fatback. Hotdogs. Bratwurst. Salted nuts and seeds. Canned beans with added salt. Canned or smoked fish. Whole eggs or egg yolks. Chicken or [place] with skin. Dairy Whole or 2% milk, cream, and half-and-half. Whole or full-fat cream cheese. Whole-fat or sweetened yogurt. Full-fat cheese. Nondairy creamers. Whipped toppings. Processed cheese and cheese spreads. Fats and oils Butter. Stick margarine. Lard. Shortening. Ghee. Bacon fat. Tropical oils, such as coconut, palm kernel, or palm oil. Seasoning and other foods Salted popcorn and pretzels. Onion salt, garlic salt, seasoned salt, table salt, and sea salt. Worcestershire sauce. Tartar sauce. Barbecue sauce. Teriyaki sauce. Soy sauce, including reduced-sodium. Steak sauce. Canned and packaged gravies. Fish sauce. Oyster sauce. Cocktail sauce. Horseradish that you find on the shelf. Ketchup. Mustard. Meat  flavorings and tenderizers. Bouillon cubes. Hot sauce and Tabasco sauce. Premade or packaged marinades. Premade or packaged taco seasonings. Relishes. Regular salad dressings. Where to find more information:  National Heart, Lung, and Blood Institute: PopSteam.is  American Heart Association: www.heart.org Summary  The DASH eating plan is a healthy eating plan that has been shown to reduce high blood pressure (hypertension). It may also reduce your risk for type 2 diabetes, heart disease, and stroke.  With the  DASH eating plan, you should limit salt (sodium) intake to 2,300 mg a day. If you have hypertension, you may need to reduce your sodium intake to 1,500 mg a day.  When on the DASH eating plan, aim to eat more fresh fruits and vegetables, whole grains, lean proteins, low-fat dairy, and heart-healthy fats.  Work with your health care provider or diet and nutrition specialist (dietitian) to adjust your eating plan to your individual calorie needs. This information is not intended to replace advice given to you by your health care provider. Make sure you discuss any questions you have with your health care provider. Document Revised: 10/30/2017 Document Reviewed: 11/10/2016 Elsevier Patient Education  2020 [employer].

## 2020-09-12 ENCOUNTER — Other Ambulatory Visit: Payer: Self-pay | Admitting: Family Medicine

## 2020-09-12 DIAGNOSIS — E559 Vitamin D deficiency, unspecified: Secondary | ICD-10-CM

## 2020-09-21 ENCOUNTER — Other Ambulatory Visit: Payer: Self-pay | Admitting: Family Medicine

## 2020-09-21 DIAGNOSIS — I10 Essential (primary) hypertension: Secondary | ICD-10-CM

## 2020-10-04 ENCOUNTER — Encounter: Payer: 59 | Admitting: Family Medicine

## 2020-10-16 ENCOUNTER — Other Ambulatory Visit: Payer: Self-pay | Admitting: Family Medicine

## 2020-10-16 DIAGNOSIS — I10 Essential (primary) hypertension: Secondary | ICD-10-CM

## 2020-11-29 ENCOUNTER — Encounter: Payer: 59 | Admitting: Family Medicine

## 2021-06-11 NOTE — Patient Instructions (Addendum)
HEALTH MAINTENANCE RECOMMENDATIONS:  It is recommended that you get at least 30 minutes of aerobic exercise at least 5 days/week (for weight loss, you may need as much as 60-90 minutes). This can be any activity that gets your heart rate up. This can be divided in 10-15 minute intervals if needed, but try and build up your endurance at least once a week.  Weight bearing exercise is also recommended twice weekly.  Eat a healthy diet with lots of vegetables, fruits and fiber.  "Colorful" foods have a lot of vitamins (ie green vegetables, tomatoes, red peppers, etc).  Limit sweet tea, regular sodas and alcoholic beverages, all of which has a lot of calories and sugar.  Up to 2 alcoholic drinks daily may be beneficial for men (unless trying to lose weight, watch sugars).  Drink a lot of water.  Sunscreen of at least SPF 30 should be used on all sun-exposed parts of the skin when outside between the hours of 10 am and 4 pm (not just when at beach or pool, but even with exercise, golf, tennis, and yard work!)  Use a sunscreen that says "broad spectrum" so it covers both UVA and UVB rays, and make sure to reapply every 1-2 hours.  Remember to change the batteries in your smoke detectors when changing your clock times in the spring and fall.  Carbon monoxide detectors are recommended for your home.  Use your seat belt every time you are in a car, and please drive safely and not be distracted with cell phones and texting while driving.  Restart the HCTZ.  Cut back on the sodium in your diet as we discussed. Monitor your blood pressure regularly.  Goal blood pressure is under 56433 (sometime up to 135/85 okay; but if consistently over 135-140/85-90, then meds need to be adjusted). Expect to stay on the medication long-term.  If your blood pressure is too low (ie 100/60 or less) or you're having problems with dizziness, please set up an appointment and bring your list of blood pressures with you.  Try and  avoid regular sodas (due to the sugar and calories).  I recommend getting Shingles vaccine (Shingrix) next year--it is usually covered by insurance once you are 50.  Consider checking with your insurance (to verify no out of pocket costs) prior to your physical next year.  Rarely, there is a high out of pocket cost for some people, so it is worth checking with insurance before we give it so there aren't any surprise bills. It is a series of 2 shots, and isn't recommended until age 59.  https://www.mata.com/.pdf">  DASH Eating Plan DASH stands for Dietary Approaches to Stop Hypertension. The DASH eating plan is a healthy eating plan that has been shown to: Reduce high blood pressure (hypertension). Reduce your risk for type 2 diabetes, heart disease, and stroke. Help with weight loss. What are tips for following this plan? Reading food labels Check food labels for the amount of salt (sodium) per serving. Choose foods with less than 5 percent of the Daily Value of sodium. Generally, foods with less than 300 milligrams (mg) of sodium per serving fit into this eating plan. To find whole grains, look for the word "whole" as the first word in the ingredient list. Shopping Buy products labeled as "low-sodium" or "no salt added." Buy fresh foods. Avoid canned foods and pre-made or frozen meals. Cooking Avoid adding salt when cooking. Use salt-free seasonings or herbs instead of table salt or sea salt.  Check with your health care provider or pharmacist before using salt substitutes. Do not fry foods. Cook foods using healthy methods such as baking, boiling, grilling, roasting, and broiling instead. Cook with heart-healthy oils, such as olive, canola, avocado, soybean, or sunflower oil. Meal planning  Eat a balanced diet that includes: 4 or more servings of fruits and 4 or more servings of vegetables each day. Try to fill one-half of your plate with fruits and  vegetables. 6-8 servings of whole grains each day. Less than 6 oz (170 g) of lean meat, poultry, or fish each day. A 3-oz (85-g) serving of meat is about the same size as a deck of cards. One egg equals 1 oz (28 g). 2-3 servings of low-fat dairy each day. One serving is 1 cup (237 mL). 1 serving of nuts, seeds, or beans 5 times each week. 2-3 servings of heart-healthy fats. Healthy fats called omega-3 fatty acids are found in foods such as walnuts, flaxseeds, fortified milks, and eggs. These fats are also found in cold-water fish, such as sardines, salmon, and mackerel. Limit how much you eat of: Canned or prepackaged foods. Food that is high in trans fat, such as some fried foods. Food that is high in saturated fat, such as fatty meat. Desserts and other sweets, sugary drinks, and other foods with added sugar. Full-fat dairy products. Do not salt foods before eating. Do not eat more than 4 egg yolks a week. Try to eat at least 2 vegetarian meals a week. Eat more home-cooked food and less restaurant, buffet, and fast food.  Lifestyle When eating at a restaurant, ask that your food be prepared with less salt or no salt, if possible. If you drink alcohol: Limit how much you use to: 0-1 drink a day for women who are not pregnant. 0-2 drinks a day for men. Be aware of how much alcohol is in your drink. In the [place] one drink equals one 12 oz bottle of beer (355 mL), one 5 oz glass of wine (148 mL), or one 1 oz glass of hard liquor (44 mL). General information Avoid eating more than 2,300 mg of salt a day. If you have hypertension, you may need to reduce your sodium intake to 1,500 mg a day. Work with your health care provider to maintain a healthy body weight or to lose weight. Ask what an ideal weight is for you. Get at least 30 minutes of exercise that causes your heart to beat faster (aerobic exercise) most days of the week. Activities may include walking, swimming, or biking. Work with  your health care provider or dietitian to adjust your eating plan to your individual calorie needs. What foods should I eat? Fruits All fresh, dried, or frozen fruit. Canned fruit in natural juice (without addedsugar). Vegetables Fresh or frozen vegetables (raw, steamed, roasted, or grilled). Low-sodium or reduced-sodium tomato and vegetable juice. Low-sodium or reduced-sodium tomatosauce and tomato paste. Low-sodium or reduced-sodium canned vegetables. Grains Whole-grain or whole-wheat bread. Whole-grain or whole-wheat pasta. Brown rice. Orpah Cobb. Bulgur. Whole-grain and low-sodium cereals. Pita bread.Low-fat, low-sodium crackers. Whole-wheat flour tortillas. Meats and other proteins Skinless chicken or [place]. Ground chicken or [place]. Pork with fat trimmed off. Fish and seafood. Egg whites. Dried beans, peas, or lentils. Unsalted nuts, nut butters, and seeds. Unsalted canned beans. Lean cuts of beef with fat trimmed off. Low-sodium, lean precooked or cured meat, such as sausages or meatloaves. Dairy Low-fat (1%) or fat-free (skim) milk. Reduced-fat, low-fat, or fat-free cheeses. Nonfat,  low-sodium ricotta or cottage cheese. Low-fat or nonfatyogurt. Low-fat, low-sodium cheese. Fats and oils Soft margarine without trans fats. Vegetable oil. Reduced-fat, low-fat, or light mayonnaise and salad dressings (reduced-sodium). Canola, safflower, olive, avocado, soybean, andsunflower oils. Avocado. Seasonings and condiments Herbs. Spices. Seasoning mixes without salt. Other foods Unsalted popcorn and pretzels. Fat-free sweets. The items listed above may not be a complete list of foods and beverages you can eat. Contact a dietitian for more information. What foods should I avoid? Fruits Canned fruit in a light or heavy syrup. Fried fruit. Fruit in cream or buttersauce. Vegetables Creamed or fried vegetables. Vegetables in a cheese sauce. Regular canned vegetables (not low-sodium or  reduced-sodium). Regular canned tomato sauce and paste (not low-sodium or reduced-sodium). Regular tomato and vegetable juice(not low-sodium or reduced-sodium). Rosita Fire. Olives. Grains Baked goods made with fat, such as croissants, muffins, or some breads. Drypasta or rice meal packs. Meats and other proteins Fatty cuts of meat. Ribs. Fried meat. Tomasa Blase. Bologna, salami, and other precooked or cured meats, such as sausages or meat loaves. Fat from the back of a pig (fatback). Bratwurst. Salted nuts and seeds. Canned beans with added salt. Canned orsmoked fish. Whole eggs or egg yolks. Chicken or [place] with skin. Dairy Whole or 2% milk, cream, and half-and-half. Whole or full-fat cream cheese. Whole-fat or sweetened yogurt. Full-fat cheese. Nondairy creamers. Whippedtoppings. Processed cheese and cheese spreads. Fats and oils Butter. Stick margarine. Lard. Shortening. Ghee. Bacon fat. Tropical oils, suchas coconut, palm kernel, or palm oil. Seasonings and condiments Onion salt, garlic salt, seasoned salt, table salt, and sea salt. Worcestershire sauce. Tartar sauce. Barbecue sauce. Teriyaki sauce. Soy sauce, including reduced-sodium. Steak sauce. Canned and packaged gravies. Fish sauce. Oyster sauce. Cocktail sauce. Store-bought horseradish. Ketchup. Mustard. Meat flavorings and tenderizers. Bouillon cubes. Hot sauces. Pre-made or packaged marinades. Pre-made or packaged taco seasonings. Relishes. Regular saladdressings. Other foods Salted popcorn and pretzels. The items listed above may not be a complete list of foods and beverages you should avoid. Contact a dietitian for more information. Where to find more information National Heart, Lung, and Blood Institute: PopSteam.is American Heart Association: www.heart.org Academy of Nutrition and Dietetics: www.eatright.org National Kidney Foundation: www.kidney.org Summary The DASH eating plan is a healthy eating plan that has been shown to  reduce high blood pressure (hypertension). It may also reduce your risk for type 2 diabetes, heart disease, and stroke. When on the DASH eating plan, aim to eat more fresh fruits and vegetables, whole grains, lean proteins, low-fat dairy, and heart-healthy fats. With the DASH eating plan, you should limit salt (sodium) intake to 2,300 mg a day. If you have hypertension, you may need to reduce your sodium intake to 1,500 mg a day. Work with your health care provider or dietitian to adjust your eating plan to your individual calorie needs. This information is not intended to replace advice given to you by your health care provider. Make sure you discuss any questions you have with your healthcare provider. Document Revised: 10/21/2019 Document Reviewed: 10/21/2019 Elsevier Patient Education  2022 [employer].

## 2021-06-11 NOTE — Progress Notes (Signed)
Chief Complaint  Patient presents with   Annual Exam    Fasting CPE, sees eye doctor yearly. Has form for work to filled out. No new concerns.     James Townsend is a 50 y.o. male who presents for a complete physical. He brings in a form needed for work--requests lipids to be done for form.  BPH and h/o UTI last year.  He reports taking flomax only about 3x/week, and symptoms have resolved.  He gets up 0-1x/night to void. Denies urinary complaints.  He has h/o vitamin D deficiency, with a level of 12 in 05/2017.  Last check was 17.9 in 05/2020, had been 13.3 in 06/2019.  He was taking 2000 IU of D3 on last check (not taking any in 2020). He was treated with another course of prescription vitamin D, and was advised to increase his daily dose to 4000-5000 IU upon completion of the prescription. Patient has been taking 5000 IU daily.  H/o elevated BP's in the past. Had been 130/80 at the time of his physical last year, but BP's went up last year after colonoscopy and increased sodium intake.  He had HA's when BP was up at that time. He was started on HCTZ, but cancelled his f/u appointments, hasn't been seen since.   He reports he was walking regularly, lost weight, BP's were good, so stopped taking medication. It had been down into the 120's/80. He can't recall if that was when he was still taking 1/2 tablet or after he stopped. Hasn't checked BP in 3 months, reports he stopped meds about 3 months ago.  In the last month he hasn't been walking, regained weight. Since daughter's graduation, hasn't been as good with diet either--hot dogs, more bread, chips. Had [place] [employer] sandwich and felt the swelling, knew he had too much salt. Denies headaches, dizziness, chest pain, palpitations, edema.  h/o hypogonadism.  Last check was in 07/2015, low at 145.  Previously treated with Axiron, then changed to Androgel for insurance reasons.  This low level was while on treatment.  He had one testosterone cypionate  injection, did not return for subsequent injections (or labs). Note in chart states that it was expensive and caused decreased libido so it was stopped. At that time he declined further therapy, and weight loss was recommended.  He currently denies decreased libido, ED or fatigue.  Prediabetes--A1c was noted to be 5.9 in 2020 (with normal fasting glucose). After cutting back on bread, limiting sweets, A1c was down to 5.6 on last check a year ago.  He drinks regular sodas (down to 1d), and an occasional sweet tea. He drinks a lot of water.  Lab Results  Component Value Date   HGBA1C 5.6 06/07/2020    Immunization History  Administered Date(s) Administered   Influenza,inj,Quad PF,6+ Mos 08/30/2020   Influenza-Unspecified 09/07/2017, 09/01/2019   PFIZER(Purple Top)SARS-COV-2 Vaccination 02/11/2020, 03/06/2020, 11/23/2020   PPD Test 09/04/2014   Tdap 06/01/2019   Last colonoscopy: 08/2020, normal; recheck 10 yrs Last PSA:  Lab Results  Component Value Date   PSA1 0.7 06/01/2019   PSA 0.60 07/11/2015   PSA 0.57 12/14/2014   PSA 0.62 12/02/2013   Dentist: every 6 months Ophtho: every year Exercise: Had been doing an exercise challenge in his neighborhood (60 miles in a month), had been walking daily. Prior to challenge was walking 3x/week., 3 miles Some lifting/pulling/pushing on the job. Just bought some bands.  Lipid: Lab Results  Component Value Date   CHOL 185 06/07/2020  HDL 76 06/07/2020   LDLCALC 98 06/07/2020   TRIG 59 06/07/2020   CHOLHDL 2.4 06/07/2020    PMH, PSH, SH and FH were reviewed and updated  Outpatient Encounter Medications as of 06/12/2021  Medication Sig Note   Cholecalciferol (VITAMIN D3 PO) Take 5,000 Int'l Units by mouth daily. 06/12/2021: Taking 5000 IU daily   tamsulosin (FLOMAX) 0.4 MG CAPS capsule TAKE 1 CAPSULE BY MOUTH EVERY DAY 06/12/2021: Reports taking it only 3x/week   hydrochlorothiazide (HYDRODIURIL) 25 MG tablet TAKE 1/2 TABLET BY MOUTH  EVERY MORNING. INCREASE TO FULL TABLET AFTER 2 WEEKS IF BP IS OVER 135-140/85-90 (Patient not taking: Reported on 06/12/2021)    [DISCONTINUED] Vitamin D, Ergocalciferol, (DRISDOL) 1.25 MG (50000 UNIT) CAPS capsule Take 1 capsule (50,000 Units total) by mouth every 7 (seven) days.    No facility-administered encounter medications on file as of 06/12/2021.   Allergies  Allergen Reactions   Shrimp [Shellfish Allergy] Itching    Itching and redness on body    ROS:  The patient denies anorexia, headaches, vision loss, decreased hearing, ear pain, hoarseness, chest pain, palpitations, dizziness, syncope, dyspnea on exertion, swelling, nausea, vomiting, diarrhea, constipation, abdominal pain, melena, hematochezia, indigestion/heartburn, hematuria, incontinence, weakened urine stream, dysuria, genital lesions, joint pains, numbness, tingling, weakness, tremor, suspicious skin lesions, depression, anxiety, abnormal bleeding/bruising, or enlarged lymph nodes. Denies ED or decreased libido. Denies fatigue Nocturia--resolved with Flomax No longer having any knee pain. He had lost down to 225#, regained it.   PHYSICAL EXAM:  BP (!) 142/106   Pulse 72   Ht 5' 8"  (1.727 m)   Wt 236 lb (107 kg)   BMI 35.88 kg/m  150/104 on repeat by MD  Wt Readings from Last 3 Encounters:  06/12/21 236 lb (107 kg)  08/30/20 232 lb 12.8 oz (105.6 kg)  08/10/20 236 lb 9.6 oz (107.3 kg)    General Appearance:    Alert, cooperative, no distress, appears stated age.  Head:    Normocephalic, without obvious abnormality, atraumatic  Eyes:    PERRL, conjunctiva clear, EOM's intact, fundi benign. Pterygium medially at the left eye, unchanged  Ears:    Normal TM's and external ear canals  Nose:   Not examined (wearing mask due to COVID-19 pandemic)  Throat:   Not examined (wearing mask due to COVID-19 pandemic)  Neck:   Supple, no lymphadenopathy;  thyroid:  no enlargement/ tenderness/nodules; no carotid bruit or JVD   Back:    Spine nontender, no curvature, ROM normal, no CVA  tenderness  Lungs:     Clear bilaterally. Good air movement, without wheezes or ronchi; respirations unlabored  Chest Wall:    No tenderness or deformity   Heart:    Regular rate and rhythm, S1 and S2 normal, no murmur, rub or gallop  Breast Exam:    No chest wall tenderness, masses or gynecomastia  Abdomen:     Soft, non-tender, nondistended, normoactive bowel sounds, no masses, no hepatosplenomegaly  Genitalia:    Normal male external genitalia without lesions.  Testicles without masses, normal size.  No inguinal hernias.  Rectal:    Normal sphincter tone, no masses or tenderness; guaiac negative stool.  Prostate smooth, no nodules, mildly enlarged.  Extremities:   No clubbing, cyanosis or edema  Pulses:   2+ and symmetric all extremities  Skin:   Skin color, texture, turgor normal, no rashes or lesions  Lymph nodes:   Cervical, supraclavicular, and axillary nodes normal  Neurologic:   Normal strength,  sensation and gait; reflexes are somewhat diminished, symmetric throughout                           Psych:   Normal mood, affect, hygiene and grooming.  Lab Results  Component Value Date   HGBA1C 5.7 (A) 06/12/2021     ASSESSMENT/PLAN:  Annual physical exam - Plan: POCT Urinalysis DIP (Proadvantage Device), Comprehensive metabolic panel, CBC with Differential/Platelet, PSA, VITAMIN D 25 Hydroxy (Vit-D Deficiency, Fractures)  Essential hypertension - Restart HCTZ 12.101m (and increase to 26mif BPs remains elevated). Counseled re: low Na diet, exercise, wt loss - Plan: Comprehensive metabolic panel, hydrochlorothiazide (MICROZIDE) 12.5 MG capsule  BPH associated with nocturia - doing well with flomax just 3x/week  Vitamin D deficiency - recheck level, now taking 5000 IU daily - Plan: VITAMIN D 25 Hydroxy (Vit-D Deficiency, Fractures)  Prediabetes - counseled re: diet, exercise, weight loss - Plan: HgB A1c, Comprehensive  metabolic panel  Screening for prostate cancer - Plan: PSA   Brought in form-- Will hold onto form until his f/u visit, since his BP was elevated today  C-met, cbc, PSA, vit D, lipid done due to needing for his form.   Discussed PSA screening (risks/benefits), recommended at least 30 minutes of aerobic activity at least 5 days/week, weight-bearing exercise at least 2x/wk; proper sunscreen use reviewed; healthy diet and alcohol recommendations (less than or equal to 2 drinks/day) reviewed; regular seatbelt use; changing batteries in smoke detectors.  Immunization recommendations discussed--continue yearly flu shots. Shingrix next year (age 50  Colonoscopy recommendations reviewed, UTD.  Consider DEXA at age [age]sooner if he checks with insurance and is covered, based on diagnosis of vit D deficiency and h/o hypogonadism)

## 2021-06-12 ENCOUNTER — Encounter: Payer: Self-pay | Admitting: Family Medicine

## 2021-06-12 ENCOUNTER — Ambulatory Visit (INDEPENDENT_AMBULATORY_CARE_PROVIDER_SITE_OTHER): Payer: 59 | Admitting: Family Medicine

## 2021-06-12 ENCOUNTER — Other Ambulatory Visit: Payer: Self-pay

## 2021-06-12 VITALS — BP 142/106 | HR 72 | Ht 68.0 in | Wt 236.0 lb

## 2021-06-12 DIAGNOSIS — Z0000 Encounter for general adult medical examination without abnormal findings: Secondary | ICD-10-CM

## 2021-06-12 DIAGNOSIS — Z125 Encounter for screening for malignant neoplasm of prostate: Secondary | ICD-10-CM | POA: Diagnosis not present

## 2021-06-12 DIAGNOSIS — N401 Enlarged prostate with lower urinary tract symptoms: Secondary | ICD-10-CM

## 2021-06-12 DIAGNOSIS — E559 Vitamin D deficiency, unspecified: Secondary | ICD-10-CM

## 2021-06-12 DIAGNOSIS — R351 Nocturia: Secondary | ICD-10-CM

## 2021-06-12 DIAGNOSIS — R7303 Prediabetes: Secondary | ICD-10-CM | POA: Diagnosis not present

## 2021-06-12 DIAGNOSIS — I10 Essential (primary) hypertension: Secondary | ICD-10-CM | POA: Diagnosis not present

## 2021-06-12 LAB — POCT GLYCOSYLATED HEMOGLOBIN (HGB A1C): Hemoglobin A1C: 5.7 % — AB (ref 4.0–5.6)

## 2021-06-12 LAB — POCT URINALYSIS DIP (PROADVANTAGE DEVICE)
Blood, UA: NEGATIVE
Glucose, UA: NEGATIVE mg/dL
Leukocytes, UA: NEGATIVE
Nitrite, UA: NEGATIVE
Specific Gravity, Urine: 1.02
Urobilinogen, Ur: NEGATIVE
pH, UA: 6 (ref 5.0–8.0)

## 2021-06-12 MED ORDER — HYDROCHLOROTHIAZIDE 12.5 MG PO CAPS: 12.5000 mg | ORAL_CAPSULE | Freq: Every day | ORAL | 0 refills | Status: DC

## 2021-06-13 ENCOUNTER — Encounter: Payer: Self-pay | Admitting: Family Medicine

## 2021-06-13 LAB — CBC WITH DIFFERENTIAL/PLATELET
Basophils Absolute: 0 10*3/uL (ref 0.0–0.2)
Basos: 0 %
EOS (ABSOLUTE): 0.2 10*3/uL (ref 0.0–0.4)
Eos: 4 %
Hematocrit: 43.6 % (ref 37.5–51.0)
Hemoglobin: 13.8 g/dL (ref 13.0–17.7)
Immature Grans (Abs): 0 10*3/uL (ref 0.0–0.1)
Immature Granulocytes: 0 %
Lymphocytes Absolute: 1.5 10*3/uL (ref 0.7–3.1)
Lymphs: 34 %
MCH: 26.4 pg — ABNORMAL LOW (ref 26.6–33.0)
MCHC: 31.7 g/dL (ref 31.5–35.7)
MCV: 84 fL (ref 79–97)
Monocytes Absolute: 0.6 10*3/uL (ref 0.1–0.9)
Monocytes: 13 %
Neutrophils Absolute: 2.2 10*3/uL (ref 1.4–7.0)
Neutrophils: 49 %
Platelets: 133 10*3/uL — ABNORMAL LOW (ref 150–450)
RBC: 5.22 x10E6/uL (ref 4.14–5.80)
RDW: 13.9 % (ref 11.6–15.4)
WBC: 4.6 10*3/uL (ref 3.4–10.8)

## 2021-06-13 LAB — PSA: Prostate Specific Ag, Serum: 1 ng/mL (ref 0.0–4.0)

## 2021-06-13 LAB — COMPREHENSIVE METABOLIC PANEL
ALT: 34 IU/L (ref 0–44)
AST: 20 IU/L (ref 0–40)
Albumin/Globulin Ratio: 1.6 (ref 1.2–2.2)
Albumin: 4.8 g/dL (ref 4.0–5.0)
Alkaline Phosphatase: 82 IU/L (ref 44–121)
BUN/Creatinine Ratio: 14 (ref 9–20)
BUN: 15 mg/dL (ref 6–24)
Bilirubin Total: 0.4 mg/dL (ref 0.0–1.2)
CO2: 25 mmol/L (ref 20–29)
Calcium: 9.4 mg/dL (ref 8.7–10.2)
Chloride: 100 mmol/L (ref 96–106)
Creatinine, Ser: 1.06 mg/dL (ref 0.76–1.27)
Globulin, Total: 3 g/dL (ref 1.5–4.5)
Glucose: 91 mg/dL (ref 65–99)
Potassium: 4.1 mmol/L (ref 3.5–5.2)
Sodium: 140 mmol/L (ref 134–144)
Total Protein: 7.8 g/dL (ref 6.0–8.5)
eGFR: 86 mL/min/{1.73_m2} (ref >59)

## 2021-06-13 LAB — VITAMIN D 25 HYDROXY (VIT D DEFICIENCY, FRACTURES): Vit D, 25-Hydroxy: 22 ng/mL — ABNORMAL LOW (ref 30.0–100.0)

## 2021-06-13 MED ORDER — VITAMIN D (ERGOCALCIFEROL) 1.25 MG (50000 UNIT) PO CAPS: 50000.0000 [IU] | ORAL_CAPSULE | ORAL | 0 refills | Status: DC

## 2021-06-13 NOTE — Addendum Note (Signed)
Addended by: Joselyn Arrow on: 06/13/2021 08:15 AM   Modules accepted: Orders

## 2021-06-14 LAB — LIPID PANEL W/O CHOL/HDL RATIO
Cholesterol, Total: 194 mg/dL (ref 100–199)
HDL: 74 mg/dL (ref >39)
LDL Chol Calc (NIH): 109 mg/dL — ABNORMAL HIGH (ref 0–99)
Triglycerides: 61 mg/dL (ref 0–149)
VLDL Cholesterol Cal: 11 mg/dL (ref 5–40)

## 2021-06-14 LAB — SPECIMEN STATUS REPORT

## 2021-07-05 ENCOUNTER — Other Ambulatory Visit: Payer: Self-pay | Admitting: Family Medicine

## 2021-07-05 DIAGNOSIS — I10 Essential (primary) hypertension: Secondary | ICD-10-CM

## 2021-07-23 NOTE — Progress Notes (Signed)
Chief Complaint  Patient presents with   Hypertension    Follow up on BP. Patient reports that he is experiencing dry since since taking the HCTZ.    Patient presents for 3 month follow-up on his blood pressure.  He needs form completed (brought in at his physical in July, but BP was elevated).  He was restarted on HCTZ 12.5mg  daily at his physical in July.  He had been on it and doing well, but had stopped taking it around April.  He hadn't checked BP's upon discontinuation, hadn't been walking, regained weight, and had been eating more hot dogs, bread, chips. BP was 142/106 at his physical. He is now back on HCTZ. He notes some dry skin, otherwise no significant side effects. He is voiding more frequently, but also is drinking a lot more water.  Voids more frequently if he has a sugary beverage.  He is trying to limit sodium in his diet. BP's are running low 130's/low 80's. Denies headaches, dizziness, chest pain, palpitations, edema, muscle cramps.  Eats occasional bananas.  He has been walking more--3 miles 2x/week for now. He cut back on fried foods, increased vegetables, cut back on salty foods.   Vitamin D deficiency: last level was 22.0 in 05/2021. Previously had been 17.9 in 05/2020, had been 13.3 in 06/2019.  He had reportedly been taking 5000 IU daily prior to the last check.  He was prescribed another 12 week course of prescription vitamin D, was asked to verify his dose at home, and to be taking 5000 IU daily.  He is still on the prescription.  Turns out he had only been taking 2000 IU prior to last blood test.   PMH, PSH, SH reviewed  Outpatient Encounter Medications as of 07/24/2021  Medication Sig Note   hydrochlorothiazide (MICROZIDE) 12.5 MG capsule TAKE 1 CAPSULE (12.5 MG TOTAL) BY MOUTH DAILY. IF YOUR BLOOD PRESSURE REMAINS OVER 135/85 AFTER 1-2 WEEKS, INCREASE TO 2 CAPSULES DAILY    tamsulosin (FLOMAX) 0.4 MG CAPS capsule TAKE 1 CAPSULE BY MOUTH EVERY DAY 06/12/2021: Reports  taking it only 3x/week   Vitamin D, Ergocalciferol, (DRISDOL) 1.25 MG (50000 UNIT) CAPS capsule Take 1 capsule (50,000 Units total) by mouth every 7 (seven) days.    Cholecalciferol (VITAMIN D3 PO) Take 5,000 Int'l Units by mouth daily. (Patient not taking: Reported on 07/24/2021) 07/24/2021: Currently on prescription; will start 5000 IU when finishes   No facility-administered encounter medications on file as of 07/24/2021.   Allergies  Allergen Reactions   Shrimp [Shellfish Allergy] Itching    Itching and redness on body    ROS: Denies fever, chills, URI symptoms, headaches, dizziness, shortness of breath, chest pain, muscle cramps, edema.  Denies nausea, vomiting, bowel changes, urinary complaints, bleeding, bruising, rash. +dry skin and some dandruff recently. See HPI   PHYSICAL EXAM:  BP 128/80   Pulse 88   Ht 5\' 8"  (1.727 m)   Wt 237 lb 3.2 oz (107.6 kg)   BMI 36.07 kg/m   Wt Readings from Last 3 Encounters:  07/24/21 237 lb 3.2 oz (107.6 kg)  06/12/21 236 lb (107 kg)  08/30/20 232 lb 12.8 oz (105.6 kg)   Well appearing male, in no distress HEENT: conjunctiva and sclera clear, EOMI, wearing mask Neck: no lymphadenopathy or thyromegaly Heart: regular rate and rhythm Lungs: clear bilaterally Extremities: no edema Psych: normal mood, affect, hygiene and grooming Neuro: alert and oriented, normal gait    ASSESSMENT/PLAN:  Essential hypertension - BP improved.  Cont HCTZ, low Na diet, increase exercise, weight loss encouraged - Plan: Basic metabolic panel, hydrochlorothiazide (MICROZIDE) 12.5 MG capsule  Vitamin D deficiency - to start 5000 IU upon completion of Rx  Need for influenza vaccination - Plan: Flu Vaccine QUAD 6+ mos PF IM (Fluarix Quad PF)  Medication monitoring encounter - Plan: Basic metabolic panel  Form completed   Just picked up another #60 (still had a month left, now has 3 month supply at home, since only taking 1/d).  Rx changed in system, no  print, to 90 so that we can fill with proper directions for 90d supply when refills are needed.

## 2021-07-24 ENCOUNTER — Encounter: Payer: Self-pay | Admitting: Family Medicine

## 2021-07-24 ENCOUNTER — Other Ambulatory Visit: Payer: Self-pay

## 2021-07-24 ENCOUNTER — Ambulatory Visit (INDEPENDENT_AMBULATORY_CARE_PROVIDER_SITE_OTHER): Payer: 59 | Admitting: Family Medicine

## 2021-07-24 VITALS — BP 128/80 | HR 88 | Ht 68.0 in | Wt 237.2 lb

## 2021-07-24 DIAGNOSIS — Z5181 Encounter for therapeutic drug level monitoring: Secondary | ICD-10-CM

## 2021-07-24 DIAGNOSIS — E559 Vitamin D deficiency, unspecified: Secondary | ICD-10-CM

## 2021-07-24 DIAGNOSIS — I10 Essential (primary) hypertension: Secondary | ICD-10-CM

## 2021-07-24 DIAGNOSIS — Z23 Encounter for immunization: Secondary | ICD-10-CM | POA: Diagnosis not present

## 2021-07-24 MED ORDER — HYDROCHLOROTHIAZIDE 12.5 MG PO CAPS: 12.5000 mg | ORAL_CAPSULE | Freq: Every day | ORAL | 1 refills | Status: DC

## 2021-07-24 NOTE — Patient Instructions (Signed)
Please buy Vitamin D3 5000 IU to have at home and use upon completion of the prescription vitamin D (you can use up your current supply of 2000 IU by taking 2/day until gone, then starting the 5000 IU dose and continuing that dose long-term).  Your blood pressures is much better. Continue the HCTZ and low sodium diet. Continue improved diet and work on weight loss.  Stop the sodas.

## 2021-07-25 LAB — BASIC METABOLIC PANEL
BUN/Creatinine Ratio: 13 (ref 9–20)
BUN: 14 mg/dL (ref 6–24)
CO2: 25 mmol/L (ref 20–29)
Calcium: 9.4 mg/dL (ref 8.7–10.2)
Chloride: 98 mmol/L (ref 96–106)
Creatinine, Ser: 1.1 mg/dL (ref 0.76–1.27)
Glucose: 88 mg/dL (ref 65–99)
Potassium: 4.2 mmol/L (ref 3.5–5.2)
Sodium: 138 mmol/L (ref 134–144)
eGFR: 82 mL/min/{1.73_m2} (ref >59)

## 2021-08-01 ENCOUNTER — Other Ambulatory Visit: Payer: Self-pay | Admitting: Family Medicine

## 2021-08-01 DIAGNOSIS — I10 Essential (primary) hypertension: Secondary | ICD-10-CM

## 2021-10-09 ENCOUNTER — Other Ambulatory Visit: Payer: Self-pay | Admitting: *Deleted

## 2021-10-09 ENCOUNTER — Encounter: Payer: Self-pay | Admitting: Family Medicine

## 2021-10-09 DIAGNOSIS — N401 Enlarged prostate with lower urinary tract symptoms: Secondary | ICD-10-CM

## 2021-10-09 MED ORDER — TAMSULOSIN HCL 0.4 MG PO CAPS: 0.4000 mg | ORAL_CAPSULE | Freq: Every day | ORAL | 0 refills | Status: DC

## 2021-12-09 ENCOUNTER — Encounter: Payer: Self-pay | Admitting: Family Medicine

## 2021-12-24 NOTE — Progress Notes (Signed)
Chief Complaint  Patient presents with   Hypertension    Med check, patient is fasting. He is taking 5,000iu daily of vitamin d and takes is flomax daily as well. No concerns. (Has been taking HCTZ every other day. He sent me a message for a refill 12/09/21-see message. Told him to call [facility] for a refill)     Patient presents for 6 month med check, follow up on chronic problems.  Hypertension follow-up: He was restarted on HCTZ 12.5mg  daily at his physical in July.  He denies significant side effects.  He cut back to qod just recently, while running low and waiting on refill (he didn't realize he had a refill at the pharmacy, then called, but pharmacy put back on shelf before he got there, delayed him). He didn't take it today, but will pick up from pharmacy today. He is trying to limit sodium in his diet. BP's are running 118-120's/(can't recall diastolic, but "not high").  It was good at occ med last month. Denies headaches, dizziness, chest pain, palpitations, edema, muscle cramps.  No longer eating bananas, but no muscle cramps/spasms. Not walking as much since the weather is colder.  Doing strength training.   +weight gain--admits to late night eating (burgers, corn flakes) in the last few weeks.   Vitamin D deficiency: last level was 22.0 in 05/2021, when taking 2000 IU daily. (Previously had been 17.9 in 05/2020, had been 13.3 in 06/2019).  He completed another course of prescription therapy, and is currently taking 5000 IU daily.  BPH: He reports taking flomax daily, denies side effects.  He gets up 1x/night to void. Thinks drinking while snacking contributes some. Denies urinary complaints.  h/o hypogonadism.  He currently denies decreased libido, ED or fatigue. Last check of testosterone was in 07/2015, low at 145 (while on treatment).  Previously treated with Axiron, then changed to Androgel for insurance reasons.  He had one testosterone cypionate injection, did not return for subsequent  injections (or labs). Note in chart states that it was expensive and caused decreased libido so it was stopped. At that time he declined further therapy, and weight loss was recommended.      Prediabetes--A1c was noted to be 5.9 in 2020 (with normal fasting glucose). After cutting back on bread, limiting sweets, A1c came down to 5.6; was 5.7 on last check in July.   He drinks regular sodas about 4 days/week, rarely has sweet tea. He drinks a lot of water.  Lab Results  Component Value Date   HGBA1C 5.7 (A) 06/12/2021    PMH, PSH, SH reviewed  Outpatient Encounter Medications as of 12/25/2021  Medication Sig Note   Cholecalciferol (VITAMIN D) 125 MCG (5000 UT) CAPS Take 1 capsule by mouth daily.    tamsulosin (FLOMAX) 0.4 MG CAPS capsule Take 1 capsule (0.4 mg total) by mouth daily.    hydrochlorothiazide (MICROZIDE) 12.5 MG capsule Take 1 capsule (12.5 mg total) by mouth daily. (Patient not taking: Reported on 12/25/2021) 12/25/2021: Has been taking every other day.    [DISCONTINUED] Cholecalciferol (VITAMIN D3 PO) Take 5,000 Int'l Units by mouth daily. (Patient not taking: Reported on 07/24/2021) 07/24/2021: Currently on prescription; will start 5000 IU when finishes   [DISCONTINUED] Vitamin D, Ergocalciferol, (DRISDOL) 1.25 MG (50000 UNIT) CAPS capsule Take 1 capsule (50,000 Units total) by mouth every 7 (seven) days.    No facility-administered encounter medications on file as of 12/25/2021.   Allergies  Allergen Reactions   Shrimp [Shellfish Allergy] Itching  Itching and redness on body    ROS: Denies fever, chills, URI symptoms, headaches, dizziness, shortness of breath, chest pain, muscle cramps, edema.  Denies nausea, vomiting, bowel changes, urinary complaints, ED or decreased libido. Denies bleeding, bruising, rash.  He injured his back at work 12/9, better now. Rest and heat helped.    PHYSICAL EXAM:  BP 138/84    Pulse 76    Ht 5\' 8"  (1.727 m)    Wt 250 lb (113.4 kg)    BMI  38.01 kg/m   Wt Readings from Last 3 Encounters:  12/25/21 250 lb (113.4 kg)  07/24/21 237 lb 3.2 oz (107.6 kg)  06/12/21 236 lb (107 kg)   Well appearing male, in no distress HEENT: conjunctiva and sclera clear, EOMI, wearing mask Neck: no lymphadenopathy or thyromegaly, no carotid bruit Heart: regular rate and rhythm Lungs: clear bilaterally Back: no spinal or CVA tenderness Abdomen: soft, nontender, no organomegaly or mass Extremities: no edema Psych: normal mood, affect, hygiene and grooming Neuro: alert and oriented, normal gait   ASSESSMENT/PLAN:   Essential hypertension - has missed some doses. BP's excellent at home when taking daily. Discussed low Na diet, daily exercise, wt loss  Vitamin D deficiency - due for recheck, taking 5000 IU daily - Plan: VITAMIN D 25 Hydroxy (Vit-D Deficiency, Fractures)  BPH associated with nocturia - controlled  Prediabetes - counseled re: diet, exercise.   Need for COVID-19 vaccine - Plan: 06/14/21  BMI 38.0-38.9,adult - counseled in detail re: diet, exercise, wt loss   F/u in 6 months for CPE, fasting

## 2021-12-25 ENCOUNTER — Encounter: Payer: Self-pay | Admitting: Family Medicine

## 2021-12-25 ENCOUNTER — Other Ambulatory Visit: Payer: Self-pay

## 2021-12-25 ENCOUNTER — Ambulatory Visit: Payer: 59 | Admitting: Family Medicine

## 2021-12-25 VITALS — BP 138/84 | HR 76 | Ht 68.0 in | Wt 250.0 lb

## 2021-12-25 DIAGNOSIS — N401 Enlarged prostate with lower urinary tract symptoms: Secondary | ICD-10-CM | POA: Diagnosis not present

## 2021-12-25 DIAGNOSIS — R7303 Prediabetes: Secondary | ICD-10-CM | POA: Diagnosis not present

## 2021-12-25 DIAGNOSIS — Z6838 Body mass index (BMI) 38.0-38.9, adult: Secondary | ICD-10-CM

## 2021-12-25 DIAGNOSIS — I10 Essential (primary) hypertension: Secondary | ICD-10-CM

## 2021-12-25 DIAGNOSIS — R351 Nocturia: Secondary | ICD-10-CM

## 2021-12-25 DIAGNOSIS — E559 Vitamin D deficiency, unspecified: Secondary | ICD-10-CM

## 2021-12-25 DIAGNOSIS — Z23 Encounter for immunization: Secondary | ICD-10-CM

## 2021-12-25 NOTE — Patient Instructions (Signed)
Continue your same medications. Work hard on limiting regular soda, getting aerobic exercise every day (even if indoors) and avoid eating after 8pm (really not much after dinner period.) If you have to snack at night because you are truly hungry, have a piece of fruit.  I recommend getting the new shingles vaccine (Shingrix). You may want to check with your insurance to verify what your out of pocket cost may be (usually covered as preventative, but better to verify to avoid any surprises, as this vaccine is expensive), and then schedule a nurse visit at our office when convenient (based on the possible side effects as discussed).   This is a series of 2 injections, spaced 2 months apart.  It doesn't have to be exactly 2 months apart (but can't be sooner), if that isn't feasible for your schedule, but try and get them close to 2 months (and definitely within 6 months of each other, or else the efficacy of the vaccine drops off). This should be separated from other vaccines by at least 2 weeks.

## 2021-12-26 LAB — VITAMIN D 25 HYDROXY (VIT D DEFICIENCY, FRACTURES): Vit D, 25-Hydroxy: 20.5 ng/mL — ABNORMAL LOW (ref 30.0–100.0)

## 2021-12-26 MED ORDER — VITAMIN D (ERGOCALCIFEROL) 1.25 MG (50000 UNIT) PO CAPS: 50000.0000 [IU] | ORAL_CAPSULE | ORAL | 0 refills | Status: DC

## 2021-12-26 NOTE — Addendum Note (Signed)
Addended by: Rita Ohara on: 12/26/2021 08:08 AM   Modules accepted: Orders

## 2022-01-24 ENCOUNTER — Other Ambulatory Visit: Payer: Self-pay | Admitting: Family Medicine

## 2022-01-24 DIAGNOSIS — N401 Enlarged prostate with lower urinary tract symptoms: Secondary | ICD-10-CM

## 2022-01-24 DIAGNOSIS — R351 Nocturia: Secondary | ICD-10-CM

## 2022-04-24 ENCOUNTER — Other Ambulatory Visit: Payer: Self-pay | Admitting: Family Medicine

## 2022-04-24 DIAGNOSIS — N401 Enlarged prostate with lower urinary tract symptoms: Secondary | ICD-10-CM

## 2022-05-08 ENCOUNTER — Other Ambulatory Visit: Payer: Self-pay | Admitting: Family Medicine

## 2022-05-08 DIAGNOSIS — N401 Enlarged prostate with lower urinary tract symptoms: Secondary | ICD-10-CM

## 2022-05-08 NOTE — Telephone Encounter (Signed)
Insurance request for 90 days

## 2022-06-17 NOTE — Progress Notes (Unsigned)
No chief complaint on file.   James Townsend is a 51 y.o. male who presents for a complete physical. He brings in a form needed for work--requests lipids to be done for form.  Hypertension follow-up: He continues on HCTZ 12.30m daily (restarted at his physical last year).  He denies significant side effects.   He is trying to limit sodium in his diet. BP's are running   Denies headaches, dizziness, chest pain, palpitations, edema, muscle cramps.   Vitamin D deficiency: last level was 20.5 in 12/2021 when reportedly taking 5000 IU daily.  He was treated with another 12 week course of prescription replacement, and advised to resume 5000 IU daily upon completion. He is currently taking   BPH: He reports taking flomax daily, denies side effects.  He gets up 1x/night to void. Denies urinary complaints.   h/o hypogonadism.  He currently denies decreased libido, ED or fatigue. Last check of testosterone was in 07/2015, low at 145 (while on treatment).  Previously treated with Axiron, then changed to Androgel for insurance reasons.  He had one testosterone cypionate injection, did not return for subsequent injections (or labs). Note in chart states that it was expensive and caused decreased libido so it was stopped. At that time he declined further therapy, and weight loss was recommended.       Prediabetes--A1c was noted to be 5.9 in 2020 (with normal fasting glucose). After cutting back on bread, limiting sweets, A1c came down to 5.6; was 5.7 on last check in July 2022.   He drinks regular sodas about 4 days/week, rarely has sweet tea. He drinks a lot of water.   UPDATE DIET    Immunization History  Administered Date(s) Administered   Influenza,inj,Quad PF,6+ Mos 08/30/2020, 07/24/2021   Influenza-Unspecified 09/07/2017, 09/01/2019   PFIZER(Purple Top)SARS-COV-2 Vaccination 02/11/2020, 03/06/2020, 11/23/2020   PPD Test 09/04/2014   Pfizer Covid-19 Vaccine Bivalent Booster 152yr& up  12/25/2021   Tdap 06/01/2019   Zoster Recombinat (Shingrix) 04/16/2022   Last colonoscopy: 08/2020, normal; recheck 10 yrs Last PSA:  Lab Results  Component Value Date   PSA1 1.0 06/12/2021   PSA1 0.7 06/01/2019   PSA 0.60 07/11/2015   PSA 0.57 12/14/2014   PSA 0.62 12/02/2013   Dentist: every 6 months Ophtho: every year Exercise:  walking 3x/week., 3 miles Some lifting/pulling/pushing on the job. Has exercise bands.  Lipid: Lab Results  Component Value Date   CHOL 194 06/12/2021   HDL 74 06/12/2021   LDLCALC 109 (H) 06/12/2021   TRIG 61 06/12/2021   CHOLHDL 2.4 06/07/2020    PMH, PSH, SH and FH were reviewed and updated     ROS:  The patient denies anorexia, headaches, vision loss, decreased hearing, ear pain, hoarseness, chest pain, palpitations, dizziness, syncope, dyspnea on exertion, swelling, nausea, vomiting, diarrhea, constipation, abdominal pain, melena, hematochezia, indigestion/heartburn, hematuria, incontinence, weakened urine stream, dysuria, genital lesions, joint pains, numbness, tingling, weakness, tremor, suspicious skin lesions, depression, anxiety, abnormal bleeding/bruising, or enlarged lymph nodes. Denies ED or decreased libido. Denies fatigue Nocturia--resolved with Flomax No longer having any knee pain. He had lost down to 225#, regained it.   PHYSICAL EXAM:  There were no vitals taken for this visit. 150/104 on repeat by MD  Wt Readings from Last 3 Encounters:  12/25/21 250 lb (113.4 kg)  07/24/21 237 lb 3.2 oz (107.6 kg)  06/12/21 236 lb (107 kg)    General Appearance:    Alert, cooperative, no distress, appears stated age.  Head:  Normocephalic, without obvious abnormality, atraumatic  Eyes:    PERRL, conjunctiva clear, EOM's intact, fundi benign. Pterygium medially at the left eye, unchanged  Ears:    Normal TM's and external ear canals  Nose:   Not examined (wearing mask due to COVID-19 pandemic)  Throat:   Not examined (wearing  mask due to COVID-19 pandemic)  Neck:   Supple, no lymphadenopathy;  thyroid:  no enlargement/ tenderness/nodules; no carotid bruit or JVD  Back:    Spine nontender, no curvature, ROM normal, no CVA  tenderness  Lungs:     Clear bilaterally. Good air movement, without wheezes or ronchi; respirations unlabored  Chest Wall:    No tenderness or deformity   Heart:    Regular rate and rhythm, S1 and S2 normal, no murmur, rub or gallop  Breast Exam:    No chest wall tenderness, masses or gynecomastia  Abdomen:     Soft, non-tender, nondistended, normoactive bowel sounds, no masses, no hepatosplenomegaly  Genitalia:    Normal male external genitalia without lesions.  Testicles without masses, normal size.  No inguinal hernias.  Rectal:    Normal sphincter tone, no masses or tenderness; guaiac negative stool.  Prostate smooth, no nodules, mildly enlarged.  Extremities:   No clubbing, cyanosis or edema  Pulses:   2+ and symmetric all extremities  Skin:   Skin color, texture, turgor normal, no rashes or lesions  Lymph nodes:   Cervical, supraclavicular, and axillary nodes normal  Neurologic:   Normal strength, sensation and gait; reflexes are somewhat diminished, symmetric throughout                           Psych:   Normal mood, affect, hygiene and grooming.    ASSESSMENT/PLAN:   Shingrix #2  He usually brings in form--ensure we have it.  ?last HCTZ rx'd #90 x 1 RF in Sept.  Still taking?? Need RF or have at home?? (Should have been out)   A1c (fine to do with labs) C-met, cbc, PSA, vit D, lipid (usually needs this for his form.)  Consider DEXA if covered by insurance-- based on diagnosis of vit D deficiency and hypogonadism Vs recheck testosterone?  Discussed PSA screening (risks/benefits), recommended at least 30 minutes of aerobic activity at least 5 days/week, weight-bearing exercise at least 2x/wk; proper sunscreen use reviewed; healthy diet and alcohol recommendations (less than or  equal to 2 drinks/day) reviewed; regular seatbelt use; changing batteries in smoke detectors.  Immunization recommendations discussed--continue yearly flu shots. Shingrix #2 given today.  Update bivalent COVID vaccine is recommended in the Fall.  Colonoscopy recommendations reviewed, UTD.

## 2022-06-17 NOTE — Patient Instructions (Incomplete)
  HEALTH MAINTENANCE RECOMMENDATIONS:  It is recommended that you get at least 30 minutes of aerobic exercise at least 5 days/week (for weight loss, you may need as much as 60-90 minutes). This can be any activity that gets your heart rate up. This can be divided in 10-15 minute intervals if needed, but try and build up your endurance at least once a week.  Weight bearing exercise is also recommended twice weekly.  Eat a healthy diet with lots of vegetables, fruits and fiber.  "Colorful" foods have a lot of vitamins (ie green vegetables, tomatoes, red peppers, etc).  Limit sweet tea, regular sodas and alcoholic beverages, all of which has a lot of calories and sugar.  Up to 2 alcoholic drinks daily may be beneficial for men (unless trying to lose weight, watch sugars).  Drink a lot of water.  Sunscreen of at least SPF 30 should be used on all sun-exposed parts of the skin when outside between the hours of 10 am and 4 pm (not just when at beach or pool, but even with exercise, golf, tennis, and yard work!)  Use a sunscreen that says "broad spectrum" so it covers both UVA and UVB rays, and make sure to reapply every 1-2 hours.  Remember to change the batteries in your smoke detectors when changing your clock times in the spring and fall.  Carbon monoxide detectors are recommended for your home.  Use your seat belt every time you are in a car, and please drive safely and not be distracted with cell phones and texting while driving.   Continue yearly flu shots. I recommend getting the updated bivalent COVID booster when it becomes available in the Fall.  Please return for re-evaluation if your tingling changes--if it is more persistent, associated with pain, any spread to the arms, any weakness, etc.  As long as it is shortlived, sporadic and intermittent, let's just watch it for now.

## 2022-06-18 ENCOUNTER — Encounter: Payer: Self-pay | Admitting: Family Medicine

## 2022-06-18 ENCOUNTER — Ambulatory Visit: Payer: 59 | Admitting: Family Medicine

## 2022-06-18 VITALS — BP 128/84 | HR 80 | Ht 68.0 in | Wt 223.0 lb

## 2022-06-18 DIAGNOSIS — Z0000 Encounter for general adult medical examination without abnormal findings: Secondary | ICD-10-CM

## 2022-06-18 DIAGNOSIS — I10 Essential (primary) hypertension: Secondary | ICD-10-CM

## 2022-06-18 DIAGNOSIS — R351 Nocturia: Secondary | ICD-10-CM

## 2022-06-18 DIAGNOSIS — E291 Testicular hypofunction: Secondary | ICD-10-CM

## 2022-06-18 DIAGNOSIS — Z23 Encounter for immunization: Secondary | ICD-10-CM

## 2022-06-18 DIAGNOSIS — N401 Enlarged prostate with lower urinary tract symptoms: Secondary | ICD-10-CM

## 2022-06-18 DIAGNOSIS — R7303 Prediabetes: Secondary | ICD-10-CM | POA: Diagnosis not present

## 2022-06-18 DIAGNOSIS — E559 Vitamin D deficiency, unspecified: Secondary | ICD-10-CM | POA: Diagnosis not present

## 2022-06-18 DIAGNOSIS — R202 Paresthesia of skin: Secondary | ICD-10-CM

## 2022-06-18 DIAGNOSIS — Z5181 Encounter for therapeutic drug level monitoring: Secondary | ICD-10-CM

## 2022-06-18 DIAGNOSIS — Z125 Encounter for screening for malignant neoplasm of prostate: Secondary | ICD-10-CM | POA: Diagnosis not present

## 2022-06-19 LAB — COMPREHENSIVE METABOLIC PANEL
ALT: 24 IU/L (ref 0–44)
AST: 21 IU/L (ref 0–40)
Albumin/Globulin Ratio: 1.7 (ref 1.2–2.2)
Albumin: 4.8 g/dL (ref 4.1–5.1)
Alkaline Phosphatase: 82 IU/L (ref 44–121)
BUN/Creatinine Ratio: 12 (ref 9–20)
BUN: 15 mg/dL (ref 6–24)
Bilirubin Total: 0.3 mg/dL (ref 0.0–1.2)
CO2: 25 mmol/L (ref 20–29)
Calcium: 9.2 mg/dL (ref 8.7–10.2)
Chloride: 98 mmol/L (ref 96–106)
Creatinine, Ser: 1.3 mg/dL — ABNORMAL HIGH (ref 0.76–1.27)
Globulin, Total: 2.9 g/dL (ref 1.5–4.5)
Glucose: 93 mg/dL (ref 70–99)
Potassium: 4.2 mmol/L (ref 3.5–5.2)
Sodium: 138 mmol/L (ref 134–144)
Total Protein: 7.7 g/dL (ref 6.0–8.5)
eGFR: 67 mL/min/{1.73_m2} (ref >59)

## 2022-06-19 LAB — LIPID PANEL
Chol/HDL Ratio: 2.7 ratio (ref 0.0–5.0)
Cholesterol, Total: 192 mg/dL (ref 100–199)
HDL: 71 mg/dL (ref >39)
LDL Chol Calc (NIH): 110 mg/dL — ABNORMAL HIGH (ref 0–99)
Triglycerides: 59 mg/dL (ref 0–149)
VLDL Cholesterol Cal: 11 mg/dL (ref 5–40)

## 2022-06-19 LAB — CBC WITH DIFFERENTIAL/PLATELET
Basophils Absolute: 0 10*3/uL (ref 0.0–0.2)
Basos: 1 %
EOS (ABSOLUTE): 0.2 10*3/uL (ref 0.0–0.4)
Eos: 4 %
Hematocrit: 43.1 % (ref 37.5–51.0)
Hemoglobin: 14 g/dL (ref 13.0–17.7)
Immature Grans (Abs): 0 10*3/uL (ref 0.0–0.1)
Immature Granulocytes: 0 %
Lymphocytes Absolute: 1.6 10*3/uL (ref 0.7–3.1)
Lymphs: 44 %
MCH: 26.7 pg (ref 26.6–33.0)
MCHC: 32.5 g/dL (ref 31.5–35.7)
MCV: 82 fL (ref 79–97)
Monocytes Absolute: 0.5 10*3/uL (ref 0.1–0.9)
Monocytes: 12 %
Neutrophils Absolute: 1.4 10*3/uL (ref 1.4–7.0)
Neutrophils: 39 %
Platelets: 132 10*3/uL — ABNORMAL LOW (ref 150–450)
RBC: 5.25 x10E6/uL (ref 4.14–5.80)
RDW: 13.9 % (ref 11.6–15.4)
WBC: 3.6 10*3/uL (ref 3.4–10.8)

## 2022-06-19 LAB — HEMOGLOBIN A1C
Est. average glucose Bld gHb Est-mCnc: 117 mg/dL
Hgb A1c MFr Bld: 5.7 % — ABNORMAL HIGH (ref 4.8–5.6)

## 2022-06-19 LAB — VITAMIN D 25 HYDROXY (VIT D DEFICIENCY, FRACTURES): Vit D, 25-Hydroxy: 37.6 ng/mL (ref 30.0–100.0)

## 2022-06-19 LAB — TESTOSTERONE: Testosterone: 381 ng/dL (ref 264–916)

## 2022-06-19 LAB — PSA: Prostate Specific Ag, Serum: 0.7 ng/mL (ref 0.0–4.0)

## 2022-07-29 ENCOUNTER — Other Ambulatory Visit: Payer: Self-pay | Admitting: Family Medicine

## 2022-07-29 DIAGNOSIS — I10 Essential (primary) hypertension: Secondary | ICD-10-CM

## 2022-08-06 ENCOUNTER — Encounter: Payer: Self-pay | Admitting: Internal Medicine

## 2022-12-02 ENCOUNTER — Encounter: Payer: Self-pay | Admitting: Family Medicine

## 2022-12-04 ENCOUNTER — Encounter: Payer: Self-pay | Admitting: Family Medicine

## 2022-12-04 ENCOUNTER — Ambulatory Visit: Payer: 59 | Admitting: Family Medicine

## 2022-12-04 VITALS — BP 134/86 | HR 84 | Temp 97.4°F | Ht 68.0 in | Wt 223.8 lb

## 2022-12-04 DIAGNOSIS — R052 Subacute cough: Secondary | ICD-10-CM | POA: Diagnosis not present

## 2022-12-04 NOTE — Patient Instructions (Signed)
Stay well hydrated. Try taking a claritin or zyrtec to see if this helps with the cough (I suspect postnasal drainage is contributing).  In the past, you got better with using an antihistamine and prilosec (there may have been a reflux component in 10/2022).  If these measures aren't helping, let us know and we can do another steroid course, but based on today's exam, I don't think that the potential risk outweighs any benefit.  Continue with the mucinex DM, as well as lozenges, tea with honey.

## 2022-12-04 NOTE — Progress Notes (Signed)
Chief Complaint  Patient presents with   Cough    Had covid 11/10/2022, didn't really have much of a cough while he had covid. Once he got well the cough got really bad and he just cannot get rid of it. He tried mucinex DM, didn't help.   Patient presents with complaint of cough.  He had COVID 12/11, reports he was treated by UC with Prednisone (he declined Paxlovid, was given prednisone as option), and promethazine DM syrup.  Coughing got worse after better from COVID.  Denies sniffling, sneezing, no allergy symptoms. Has a little scratchy throat. Coughs throughout the day, not any worse at night. Cough is sometimes dry, sometimes productive of a very thin mucus, not discolored.  Denies wheezing, chest tightness. Denies any shortness of breath. Denies fever/chills (not since illness with COVID, lasted x 2d).  He tried Mucinex DM and cough drops, not helping much.  Last used it 3 days ago. Vick's Vapo-rub--used it around the outside of the nose, which helped a lot, didn't cough that evening.  In the past, whenever he has an illness, he has a persistent cough requiring treatment.  Hasn't been sick since [place] in 2020. At that time he was treated with steroids, had persistent cough after the steroids, and ultimately it was suggested that he take claritin and prilosec. Symptoms resolved after that.   PMH, PSH, SH reviewed  Outpatient Encounter Medications as of 12/04/2022  Medication Sig Note   Cholecalciferol (VITAMIN D) 125 MCG (5000 UT) CAPS Take 1 capsule by mouth daily. 06/18/2022: Plus 15mcg K2   hydrochlorothiazide (MICROZIDE) 12.5 MG capsule TAKE 1 CAPSULE BY MOUTH EVERY DAY    tamsulosin (FLOMAX) 0.4 MG CAPS capsule TAKE 1 CAPSULE BY MOUTH EVERY DAY    predniSONE (DELTASONE) 20 MG tablet Take 60 mg by mouth every morning. (Patient not taking: Reported on 12/04/2022) 12/04/2022: Given while he had covid   promethazine-dextromethorphan (PROMETHAZINE-DM) 6.25-15 MG/5ML syrup Take 5 mLs by  mouth every 4 (four) hours. (Patient not taking: Reported on 12/04/2022) 12/04/2022: Given while he had covid   No facility-administered encounter medications on file as of 12/04/2022.   Allergies  Allergen Reactions   Shrimp [Shellfish Allergy] Itching    Itching and redness on body   ROS:  Cough and scratchy throat per HPI.  No fever, chills, shortness of breath, chest pain. No n/v/d. No bleeding, bruising, rashes. See HPI   PHYSICAL EXAM:  BP 134/86   Pulse 84   Temp (!) 97.4 F (36.3 C) (Tympanic)   Ht 5\' 8"  (1.727 m)   Wt 223 lb 12.8 oz (101.5 kg)   BMI 34.03 kg/m   Wt Readings from Last 3 Encounters:  12/04/22 223 lb 12.8 oz (101.5 kg)  06/18/22 223 lb (101.2 kg)  12/25/21 250 lb (113.4 kg)   Well-appearing, pleasant male, in good spirits.  He is in no distress, speaking comfortably.  No significant coughing during his visit. No sniffling or throat-clearing either. HEENT: conjunctiva and sclera are clear, EOMI.  TM's and EAC's are normal. Nasal mucosa--moderately edematous bilaterally.  No purulence.  Sinuses nontender. OP clear, no erythema or lesions Neck: no lymphadenopathy or mass Heart: regular rate and rhythm Lungs: clear bilaterally Neuro: alert and oriented, cranial nerves intact, normal gait Psych: normal mood, affect, hygiene and grooming   ASSESSMENT/PLAN:  Subacute cough - dry or productive of clear, thin phlegm. Suspect related to nasal congestion/PND based on exam. Trial of antihistamine (+/- PPI if not helping).  Discussed  the lack of indication for use of steroids at this time, and potential risks. Rec once daily antihistamine, +/- prilosec if antihistamine not helping alone (sx don't sound c/w reflux currently, as they did in the past--no worsening cough related to eating/reclining). Continue mucinex DM.  To contact us if cough persists/worsens.   Stay well hydrated. Try taking a claritin or zyrtec to see if this helps with the cough (I suspect  postnasal drainage is contributing).  In the past, you got better with using an antihistamine and prilosec (there may have been a reflux component in 10/2022).  If these measures aren't helping, let us know and we can do another steroid course, but based on today's exam, I don't think that the potential risk outweighs any benefit.  Continue with the mucinex DM, as well as lozenges, tea with honey.

## 2022-12-22 ENCOUNTER — Encounter: Payer: 59 | Admitting: Family Medicine

## 2022-12-24 ENCOUNTER — Encounter: Payer: Self-pay | Admitting: Family Medicine

## 2023-02-03 NOTE — Progress Notes (Unsigned)
No chief complaint on file.   Patient presents for 6 month follow-up on chronic problems.  He was last seen in early January with complaint of a nagging cough, which persisted since COVID infection in December.  He was treated with prednisone during his COVID infection cough improved some, but then got worse. He wasn't having any shortness of breath, or significant allergy symptoms other than a scratchy throat. He was encouraged to take a daily antihistamine, continue Mucinex DM, and add prilosec daily if cough persisted.  He sent a message 1/24 stating he still had the nagging cough, and was asked to schedule a f/u visit.  UPDATE  Hypertension follow-up: He continues on HCTZ 12.'5mg'$  daily and denies significant side effects.   He is trying to limit sodium in his diet. BP's are running   BP Readings from Last 3 Encounters:  12/04/22 134/86  06/18/22 128/84  12/25/21 138/84   Denies headaches, dizziness, chest pain, palpitations, edema, muscle cramps.    Vitamin D deficiency: last level was 37.6 in 05/2022, when taking 5000 IU of D3 (with K2).  Prior level had been low at 20.5 in 12/2021 when reportedly taking 5000 IU daily.   He is currently taking 5000 IU (with K2).   BPH: He reports taking flomax daily, denies side effects.  He gets up 1x/night to void. Denies urinary complaints.   h/o hypogonadism. Last check of testosterone was in 07/2015, low at 145 (while on treatment).  Previously treated with Axiron, then changed to Androgel for insurance reasons.  He had one testosterone cypionate injection, did not return for subsequent injections (or labs). Note in chart states that it was expensive and caused decreased libido so it was stopped. At that time he declined further therapy, and weight loss was recommended.  He has lost weight, and testosterone levels improved.   He currently denies any decreased libido, ED or significant fatigue.  Lab Results  Component Value Date   TESTOSTERONE 381  06/18/2022   Prediabetes--A1c was noted to be 5.9 in 2020 (with normal fasting glucose). After cutting back on bread, limiting sweets, A1c came down to 5.6; was 5.7 on last check in July 2023.   He no longer drinks regular sodas (rare sip), mostly drinks water.  Rarely has sweet tea. Limiting his carbs, sweets.  He has lost weight trying to follow low carb diet, and intermittent fasting.  Eats 11-12 until 5-6 pm, cut out breads, fried foods.  Lab Results  Component Value Date   HGBA1C 5.7 (H) 06/18/2022     PMH, PSH, SH reviewed   ROS: Denies fever, chills, URI symptoms, headaches, dizziness, shortness of breath, chest pain, muscle cramps, edema.  Denies nausea, vomiting, bowel changes, urinary complaints, ED or decreased libido. Denies bleeding, bruising, rash.    Cough? Tingling of upper back?   PHYSICAL EXAM:  There were no vitals taken for this visit.  Wt Readings from Last 3 Encounters:  12/04/22 223 lb 12.8 oz (101.5 kg)  06/18/22 223 lb (101.2 kg)  12/25/21 250 lb (113.4 kg)   Well appearing male, in no distress HEENT: conjunctiva and sclera clear, EOMI, wearing mask Neck: no lymphadenopathy or thyromegaly, no carotid bruit Heart: regular rate and rhythm Lungs: clear bilaterally Back: no spinal or CVA tenderness Abdomen: soft, nontender, no organomegaly or mass Extremities: no edema Psych: normal mood, affect, hygiene and grooming Neuro: alert and oriented, normal gait  ***UPDATE IF NOT WEARING MASK   ASSESSMENT/PLAN:  Did he get a flu  shot?  COVID booster?  Offer/decline  Consider b-met (Cr up on last check) Lab Results  Component Value Date   CREATININE 1.30 (H) 06/18/2022   RF HCTZ ?tamsulosin  F/u as scheduled in August for CPE

## 2023-02-04 ENCOUNTER — Encounter: Payer: Self-pay | Admitting: Family Medicine

## 2023-02-04 ENCOUNTER — Ambulatory Visit: Payer: 59 | Admitting: Family Medicine

## 2023-02-04 VITALS — BP 142/90 | HR 80 | Ht 68.0 in | Wt 211.4 lb

## 2023-02-04 DIAGNOSIS — N401 Enlarged prostate with lower urinary tract symptoms: Secondary | ICD-10-CM

## 2023-02-04 DIAGNOSIS — Z5181 Encounter for therapeutic drug level monitoring: Secondary | ICD-10-CM

## 2023-02-04 DIAGNOSIS — I10 Essential (primary) hypertension: Secondary | ICD-10-CM

## 2023-02-04 DIAGNOSIS — E559 Vitamin D deficiency, unspecified: Secondary | ICD-10-CM | POA: Diagnosis not present

## 2023-02-04 DIAGNOSIS — R7303 Prediabetes: Secondary | ICD-10-CM

## 2023-02-04 DIAGNOSIS — E291 Testicular hypofunction: Secondary | ICD-10-CM

## 2023-02-04 DIAGNOSIS — R7989 Other specified abnormal findings of blood chemistry: Secondary | ICD-10-CM

## 2023-02-04 DIAGNOSIS — R351 Nocturia: Secondary | ICD-10-CM

## 2023-02-04 LAB — BASIC METABOLIC PANEL
BUN/Creatinine Ratio: 9 (ref 9–20)
BUN: 9 mg/dL (ref 6–24)
CO2: 23 mmol/L (ref 20–29)
Calcium: 9.2 mg/dL (ref 8.7–10.2)
Chloride: 102 mmol/L (ref 96–106)
Creatinine, Ser: 1.01 mg/dL (ref 0.76–1.27)
Glucose: 97 mg/dL (ref 70–99)
Potassium: 4.4 mmol/L (ref 3.5–5.2)
Sodium: 140 mmol/L (ref 134–144)
eGFR: 91 mL/min/{1.73_m2} (ref >59)

## 2023-02-04 MED ORDER — HYDROCHLOROTHIAZIDE 12.5 MG PO CAPS: 12.5000 mg | ORAL_CAPSULE | Freq: Every day | ORAL | 1 refills | Status: DC

## 2023-02-04 NOTE — Patient Instructions (Addendum)
Try and avoid high sodium foods, including Pho, or other soups/broths. Please monitor your blood pressure more regularly over the next 4 weeks, and send [place] a list of your blood pressures (or call with them). If they remain >130/80 consistently, we may need to increase the dose of the HCTZ.  We are rechecking your kidney test today (was up some in July). Continue to limit/avoid use of anti-inflammatories such as Aleve and advil (ibuprofen).  Use tylenol for pain, if needed.

## 2023-04-23 ENCOUNTER — Encounter: Payer: 59 | Admitting: Nurse Practitioner

## 2023-05-26 NOTE — Progress Notes (Unsigned)
No chief complaint on file.   James Townsend is a 52 y.o. male who presents for a complete physical.  He has the following concerns:  Hypertension follow-up: He continues on HCTZ 12.5mg  daily and denies significant side effects.   He is trying to limit sodium in his diet. BP was above goal at his last visit in March. He hadn't been checking his BP's, and had been having some saltier food (had pho twice that week, some canned foods). He was encouraged to monitor his BP more regularly, and contact us if they remain above goal.  BP's have been running   Denies headaches, dizziness, chest pain, palpitations, edema, muscle cramps.   BP Readings from Last 3 Encounters:  02/04/23 (!) 142/90  12/04/22 134/86  06/18/22 128/84     Vitamin D deficiency: last level was 37.6 in 05/2022, when taking 5000 IU of D3 (with K2).  Prior level had been low at 20.5 in 12/2021 when reportedly taking 5000 IU daily.   He is currently taking 5000 IU (with K2).  Component Ref Range & Units 11 mo ago (06/18/22) 1 yr ago (12/25/21) 1 yr ago (06/12/21) 2 yr ago (06/07/20) 3 yr ago (06/01/19) 5 yr ago (06/02/17) 7 yr ago (07/11/15)  Vit D, 25-Hydroxy 30.0 - 100.0 ng/mL 37.6 20.5 Low  CM 22.0 Low  CM 17.9 Low  CM 13.3 Low  CM 12 Low  R, CM 26 Low  R, CM     BPH: He reports taking flomax daily, denies side effects.  He gets up 1x/night to void. Denies urinary complaints. UPDATE***   h/o hypogonadism. Last level was normal at 381 in 05/2020; had been 145 (while actually on treatment) in 07/2015. Levels improved with weight loss. He currently denies any decreased libido, ED or significant fatigue.   Lab Results  Component Value Date   TESTOSTERONE 381 06/18/2022    Prediabetes--A1c was noted to be 5.9 in 2020 (with normal fasting glucose). After cutting back on bread, limiting sweets, A1c came down to 5.6; was 5.7 on last check in July 2023.   He no longer drinks regular sodas (rare sip), mostly drinks water.   Rarely has sweet tea. Limiting his carbs, sweets. He has continued to lose weight, following a low carb diet, and intermittent fasting.  Eats 12 until 7 pm, cut out breads, fried foods.   Lab Results  Component Value Date   HGBA1C 5.7 (H) 06/18/2022      Immunization History  Administered Date(s) Administered   Influenza,inj,Quad PF,6+ Mos 08/30/2020, 07/24/2021   Influenza-Unspecified 09/07/2017, 09/01/2019   PFIZER(Purple Top)SARS-COV-2 Vaccination 02/11/2020, 03/06/2020, 11/23/2020   PPD Test 09/04/2014   Pfizer Covid-19 Vaccine Bivalent Booster 44yrs & up 12/25/2021   Tdap 06/01/2019   Zoster Recombinat (Shingrix) 04/16/2022, 06/18/2022   Last colonoscopy: 08/2020, normal; recheck 10 yrs Last PSA:  Lab Results  Component Value Date   PSA1 0.7 06/18/2022   PSA1 1.0 06/12/2021   PSA1 0.7 06/01/2019   PSA 0.60 07/11/2015   PSA 0.57 12/14/2014   PSA 0.62 12/02/2013   Dentist: every 6 months Ophtho: every year Exercise:  Walking?  Biking? UPDATE *** Very active on the job (lots of walking, pulling/pushing/lifting). Has exercise bands, and dumbbells, not using lately.  Lipid: Lab Results  Component Value Date   CHOL 192 06/18/2022   HDL 71 06/18/2022   LDLCALC 110 (H) 06/18/2022   TRIG 59 06/18/2022   CHOLHDL 2.7 06/18/2022    PMH, PSH, SH and FH  were reviewed and updated    ROS:  The patient denies anorexia, headaches, vision loss, decreased hearing, ear pain, hoarseness, chest pain, palpitations, dizziness, syncope, dyspnea on exertion, swelling, nausea, vomiting, diarrhea, constipation, abdominal pain, melena, hematochezia, indigestion/heartburn, hematuria, incontinence, weakened urine stream, dysuria, genital lesions, joint pains, numbness, weakness, tremor, suspicious skin lesions, depression, anxiety, abnormal bleeding/bruising, or enlarged lymph nodes. Denies ED or decreased libido. Nocturia--resolved with Flomax (up 0-1 time/night)   PHYSICAL  EXAM:  There were no vitals taken for this visit.  Wt Readings from Last 3 Encounters:  02/04/23 211 lb 6.4 oz (95.9 kg)  12/04/22 223 lb 12.8 oz (101.5 kg)  06/18/22 223 lb (101.2 kg)    General Appearance:    Alert, cooperative, no distress, appears stated age.  Head:    Normocephalic, without obvious abnormality, atraumatic  Eyes:    PERRL, conjunctiva clear, EOM's intact, fundi benign. Pterygium medially at the left eye, unchanged  Ears:    Normal TM's and external ear canals  Nose:   Normal, no drainage or sinus tenderness  Throat:   Normal, no lesions or erythema  Neck:   Supple, no lymphadenopathy;  thyroid:  no enlargement/ tenderness/nodules; no carotid bruit or JVD  Back:    Spine nontender, no curvature, ROM normal, no CVA  tenderness.   Lungs:     Clear bilaterally. Good air movement, without wheezes or ronchi; respirations unlabored  Chest Wall:    No tenderness or deformity   Heart:    Regular rate and rhythm, S1 and S2 normal, no murmur, rub or gallop  Breast Exam:    No chest wall tenderness, masses or gynecomastia  Abdomen:     Soft, non-tender, nondistended, normoactive bowel sounds, no masses, no hepatosplenomegaly  Genitalia:    Normal male external genitalia without lesions.  Testicles without masses, normal size.  No inguinal hernias.  Rectal:    Normal sphincter tone, no masses or tenderness; guaiac negative stool.  Prostate smooth, no nodules, mild-mod enlarged.  Extremities:   No clubbing, cyanosis or edema  Pulses:   2+ and symmetric all extremities  Skin:   Skin color, texture, turgor normal, no rashes or lesions  Lymph nodes:   Cervical, supraclavicular, and inguinal nodes normal  Neurologic:   Normal strength, sensation and gait; reflexes are somewhat diminished, symmetric throughout                           Psych:   Normal mood, affect, hygiene and grooming.   ASSESSMENT/PLAN:  Is he not taking tamsulosin anymore?  Is he having more urinary  complaints?  Want/need to restart?? When/why did he stop?  Did he get flu shot or updated COVID booster? His insurance has always required his lipids to be done every year.  I added this--if this isn't true, then can remove Testosterone not needed/removed    Discussed PSA screening (risks/benefits), recommended at least 30 minutes of aerobic activity at least 5 days/week, weight-bearing exercise at least 2x/wk; proper sunscreen use reviewed; healthy diet and alcohol recommendations (less than or equal to 2 drinks/day) reviewed; regular seatbelt use; changing batteries in smoke detectors.  Immunization recommendations discussed--continue yearly flu shots.  Updated bivalent COVID vaccine is recommended in the Fall.  Colonoscopy recommendations reviewed, UTD.     F/u 6 mos med check

## 2023-05-27 ENCOUNTER — Encounter: Payer: Self-pay | Admitting: Family Medicine

## 2023-05-27 ENCOUNTER — Ambulatory Visit (INDEPENDENT_AMBULATORY_CARE_PROVIDER_SITE_OTHER): Payer: 59 | Admitting: Family Medicine

## 2023-05-27 VITALS — BP 136/86 | HR 80 | Ht 68.0 in | Wt 211.6 lb

## 2023-05-27 DIAGNOSIS — Z5181 Encounter for therapeutic drug level monitoring: Secondary | ICD-10-CM

## 2023-05-27 DIAGNOSIS — E559 Vitamin D deficiency, unspecified: Secondary | ICD-10-CM | POA: Diagnosis not present

## 2023-05-27 DIAGNOSIS — N401 Enlarged prostate with lower urinary tract symptoms: Secondary | ICD-10-CM

## 2023-05-27 DIAGNOSIS — I10 Essential (primary) hypertension: Secondary | ICD-10-CM

## 2023-05-27 DIAGNOSIS — Z125 Encounter for screening for malignant neoplasm of prostate: Secondary | ICD-10-CM | POA: Diagnosis not present

## 2023-05-27 DIAGNOSIS — R7303 Prediabetes: Secondary | ICD-10-CM

## 2023-05-27 DIAGNOSIS — R351 Nocturia: Secondary | ICD-10-CM

## 2023-05-27 DIAGNOSIS — M79602 Pain in left arm: Secondary | ICD-10-CM

## 2023-05-27 DIAGNOSIS — E291 Testicular hypofunction: Secondary | ICD-10-CM | POA: Diagnosis not present

## 2023-05-27 DIAGNOSIS — Z1159 Encounter for screening for other viral diseases: Secondary | ICD-10-CM | POA: Diagnosis not present

## 2023-05-27 DIAGNOSIS — Z0000 Encounter for general adult medical examination without abnormal findings: Secondary | ICD-10-CM | POA: Diagnosis not present

## 2023-05-27 DIAGNOSIS — Z114 Encounter for screening for human immunodeficiency virus [HIV]: Secondary | ICD-10-CM

## 2023-05-27 LAB — CBC WITH DIFFERENTIAL/PLATELET

## 2023-05-27 LAB — POCT URINALYSIS DIP (PROADVANTAGE DEVICE)
Bilirubin, UA: NEGATIVE
Blood, UA: NEGATIVE
Glucose, UA: NEGATIVE mg/dL
Ketones, POC UA: NEGATIVE mg/dL
Nitrite, UA: NEGATIVE
Protein Ur, POC: NEGATIVE mg/dL
Specific Gravity, Urine: 1.01
Urobilinogen, Ur: 0.2
pH, UA: 6 (ref 5.0–8.0)

## 2023-05-27 LAB — POCT GLYCOSYLATED HEMOGLOBIN (HGB A1C): Hemoglobin A1C: 5.6 % (ref 4.0–5.6)

## 2023-05-27 LAB — HIV ANTIBODY (ROUTINE TESTING W REFLEX)

## 2023-05-27 LAB — CMP14+EGFR
ALT: 24 IU/L (ref 0–44)
Alkaline Phosphatase: 76 IU/L (ref 44–121)
BUN: 9 mg/dL (ref 6–24)
CO2: 26 mmol/L (ref 20–29)
Creatinine, Ser: 1.1 mg/dL (ref 0.76–1.27)
Glucose: 92 mg/dL (ref 70–99)
Potassium: 4.3 mmol/L (ref 3.5–5.2)

## 2023-05-27 LAB — LIPID PANEL
HDL: 79 mg/dL (ref >39)
Triglycerides: 65 mg/dL (ref 0–149)
VLDL Cholesterol Cal: 12 mg/dL (ref 5–40)

## 2023-05-27 MED ORDER — MELOXICAM 15 MG PO TABS
15.0000 mg | ORAL_TABLET | Freq: Every day | ORAL | 0 refills | Status: DC
Start: 2023-05-27 — End: 2023-09-10

## 2023-05-27 MED ORDER — TAMSULOSIN HCL 0.4 MG PO CAPS: 0.4000 mg | ORAL_CAPSULE | Freq: Every day | ORAL | 3 refills | Status: DC

## 2023-05-27 MED ORDER — HYDROCHLOROTHIAZIDE 25 MG PO TABS
25.0000 mg | ORAL_TABLET | Freq: Every day | ORAL | 2 refills | Status: DC
Start: 2023-05-27 — End: 2023-09-09

## 2023-05-27 NOTE — Patient Instructions (Addendum)
  HEALTH MAINTENANCE RECOMMENDATIONS:  It is recommended that you get at least 30 minutes of aerobic exercise at least 5 days/week (for weight loss, you may need as much as 60-90 minutes). This can be any activity that gets your heart rate up. This can be divided in 10-15 minute intervals if needed, but try and build up your endurance at least once a week.  Weight bearing exercise is also recommended twice weekly.  Eat a healthy diet with lots of vegetables, fruits and fiber.  "Colorful" foods have a lot of vitamins (ie green vegetables, tomatoes, red peppers, etc).  Limit sweet tea, regular sodas and alcoholic beverages, all of which has a lot of calories and sugar.  Up to 2 alcoholic drinks daily may be beneficial for men (unless trying to lose weight, watch sugars).  Drink a lot of water.  Sunscreen of at least SPF 30 should be used on all sun-exposed parts of the skin when outside between the hours of 10 am and 4 pm (not just when at beach or pool, but even with exercise, golf, tennis, and yard work!)  Use a sunscreen that says "broad spectrum" so it covers both UVA and UVB rays, and make sure to reapply every 1-2 hours.  Remember to change the batteries in your smoke detectors when changing your clock times in the spring and fall.  Carbon monoxide detectors are recommended for your home.  Use your seat belt every time you are in a car, and please drive safely and not be distracted with cell phones and texting while driving.   Please get your flu shot in the Fall. There likely will be an updated COVID vaccine as well, which will be recommended.  Please avoid sweet tea, limit regular soda.  Continue to try and limit your breads (and use whole grain, preferably).  Continue to try and limit fried foods. Drink more water.  We are increasing your hydrochlorothiazide to 25 mg daily.  You can use up your current supply by taking 2 capsules together. The new prescription is for a 25 mg tablet. Check  your blood pressure regularly, record the values, and bring the list to your next visit. Try and get some potassium rich foods in your diet.  Take the anti-inflammatory medication once daily with food, until your pain has resolved (expecting you to take it for 7-10 days, may use the full #15 if needed). Do not take any ibuprofen/motrin/advil/aleve/naproxen/Goody or BC powder while taking the meloxicam. You can take tylenol if needed.  Ice after activity which inflames the pain, otherwise heat at other times. Avoid overusing the arm/wrist. You can consider trying using a tennis elbow strap or a wrist brace while working, to prevent over-use. If you have ongoing issues with pain, let us know and we can send you to a sports medicine doctor for evaluation.

## 2023-05-28 LAB — LIPID PANEL
Chol/HDL Ratio: 2.2 ratio (ref 0.0–5.0)
Cholesterol, Total: 175 mg/dL (ref 100–199)
LDL Chol Calc (NIH): 84 mg/dL (ref 0–99)

## 2023-05-28 LAB — CMP14+EGFR
AST: 16 IU/L (ref 0–40)
Albumin: 4.5 g/dL (ref 3.8–4.9)
BUN/Creatinine Ratio: 8 — ABNORMAL LOW (ref 9–20)
Bilirubin Total: 0.4 mg/dL (ref 0.0–1.2)
Calcium: 9.2 mg/dL (ref 8.7–10.2)
Chloride: 99 mmol/L (ref 96–106)
Globulin, Total: 2.9 g/dL (ref 1.5–4.5)
Sodium: 140 mmol/L (ref 134–144)
Total Protein: 7.4 g/dL (ref 6.0–8.5)
eGFR: 81 mL/min/{1.73_m2} (ref >59)

## 2023-05-28 LAB — CBC WITH DIFFERENTIAL/PLATELET
Basos: 1 %
Hemoglobin: 13.6 g/dL (ref 13.0–17.7)
Immature Granulocytes: 0 %
Lymphocytes Absolute: 1.5 10*3/uL (ref 0.7–3.1)
Lymphs: 44 %
MCH: 27.1 pg (ref 26.6–33.0)
MCHC: 32.3 g/dL (ref 31.5–35.7)
MCV: 84 fL (ref 79–97)
Neutrophils Absolute: 1.3 10*3/uL — ABNORMAL LOW (ref 1.4–7.0)
Platelets: 112 10*3/uL — ABNORMAL LOW (ref 150–450)
WBC: 3.3 10*3/uL — ABNORMAL LOW (ref 3.4–10.8)

## 2023-05-28 LAB — SPECIMEN STATUS REPORT

## 2023-05-28 LAB — PSA: Prostate Specific Ag, Serum: 0.9 ng/mL (ref 0.0–4.0)

## 2023-05-28 LAB — HEPATITIS C ANTIBODY: Hep C Virus Ab: NONREACTIVE

## 2023-05-29 LAB — VITAMIN D 25 HYDROXY (VIT D DEFICIENCY, FRACTURES): Vit D, 25-Hydroxy: 23.7 ng/mL — ABNORMAL LOW (ref 30.0–100.0)

## 2023-05-29 MED ORDER — VITAMIN D (ERGOCALCIFEROL) 1.25 MG (50000 UNIT) PO CAPS: 50000.0000 [IU] | ORAL_CAPSULE | ORAL | 0 refills | Status: DC

## 2023-05-29 NOTE — Addendum Note (Signed)
Addended by: Joselyn Arrow on: 05/29/2023 07:41 AM   Modules accepted: Orders

## 2023-07-03 NOTE — Progress Notes (Deleted)
No chief complaint on file.  Patient presents for follow-up on hypertension  His BP was elevated at his recent physical.  Hydrochlorothiazide dose was increased to 25mg  daily.  He is tolerating this without side effects. BP's are running  BP Readings from Last 3 Encounters:  05/27/23 136/86  02/04/23 (!) 142/90  12/04/22 134/86    At his physical he had also been reporting L arm pain, felt to likely be a strain related to weight lifting.  He was given a course of meloxicam.  Vitamin D deficiency:  Last level in 05/2023 was low at 23.7, despite taking 5000 IU of D3 daily.  He was given an additional 12 weeks of prescription replacement, which he is currently taking.  He should resume 5000 IU daily upon completion of the prescription.    PMH, PSH, SH reviewed    ROS:  no fever, chills, headaches, dizziness, chest pain, palpitations, edema.  Denies muscle cramps.  No bleeding, bruising, rash. L arm pain    PHYSICAL EXAM:  There were no vitals taken for this visit.  Wt Readings from Last 3 Encounters:  05/27/23 211 lb 9.6 oz (96 kg)  02/04/23 211 lb 6.4 oz (95.9 kg)  12/04/22 223 lb 12.8 oz (101.5 kg)   Well appearing male, in no distress HEENT: conjunctiva and sclera clear, EOMI Neck: no lymphadenopathy or thyromegaly Heart: regular rate and rhythm Lungs: clear bilaterally Abdomen: soft, nontender, no organomegaly or mass Extremities: no edema Psych: normal mood, affect, hygiene and grooming Neuro: alert and oriented, normal gait   ASSESSMENT/PLAN:    Will need med check scheduled (only has July 2025 CPE), when depends on plan today. Consider rechecking CBC at that visit due to dropping WBC and plt   Will need hydrochlorothiazide refilled by the end of the month--if b-met okay, can change to 90d refills with next request.   Complete course of vitamin D, and be sure to resume 5000 IU over-the-counter dose daily upon completion of the prescription.

## 2023-07-06 ENCOUNTER — Encounter: Payer: 59 | Admitting: Family Medicine

## 2023-07-06 DIAGNOSIS — I10 Essential (primary) hypertension: Secondary | ICD-10-CM

## 2023-08-18 ENCOUNTER — Other Ambulatory Visit: Payer: Self-pay | Admitting: Family Medicine

## 2023-09-09 NOTE — Progress Notes (Unsigned)
No chief complaint on file.  He was originally scheduled to be seen in August, cancelled and r/s to today. He is here for f/u on HTN.  His BP was elevated at his physical in June.  Hydrochlorothiazide dose was increased to 25mg  daily.   He is tolerating this without side effects. BP's are running   BP Readings from Last 3 Encounters:  05/27/23 136/86  02/04/23 (!) 142/90  12/04/22 134/86     At his physical he had also been reporting L arm pain, felt to likely be a strain related to weight lifting.  He was given a course of meloxicam.   Vitamin D deficiency:  Last level in 05/2023 was low at 23.7, despite taking 5000 IU of D3 daily.  He was given an additional 12 weeks of prescription replacement, and was advised to resume 5000 IU daily upon completion of the prescription. He is currently taking   WBC and platelets were noted to be lower than prior values.  Denies illness, bleeding, bruising.  Lab Results  Component Value Date   WBC 3.3 (L) 05/27/2023   HGB 13.6 05/27/2023   HCT 42.1 05/27/2023   MCV 84 05/27/2023   PLT 112 (L) 05/27/2023          PMH, PSH, SH reviewed       ROS:  no fever, chills, headaches, dizziness, chest pain, palpitations, edema.  Denies muscle cramps.  No bleeding, bruising, rash. L arm pain       PHYSICAL EXAM:   There were no vitals taken for this visit.  Wt Readings from Last 3 Encounters:  05/27/23 211 lb 9.6 oz (96 kg)  02/04/23 211 lb 6.4 oz (95.9 kg)  12/04/22 223 lb 12.8 oz (101.5 kg)      Well appearing male, in no distress HEENT: conjunctiva and sclera clear, EOMI Neck: no lymphadenopathy or thyromegaly Heart: regular rate and rhythm Lungs: clear bilaterally Abdomen: soft, nontender, no organomegaly or mass Extremities: no edema Psych: normal mood, affect, hygiene and grooming Neuro: alert and oriented, normal gait     ASSESSMENT/PLAN:     Flu Offer/decline COVID    Ensure he resumed 5000 IU over-the-counter  dose daily upon completion of the prescription.

## 2023-09-10 ENCOUNTER — Encounter: Payer: Self-pay | Admitting: Family Medicine

## 2023-09-10 ENCOUNTER — Ambulatory Visit: Payer: 59 | Admitting: Family Medicine

## 2023-09-10 VITALS — BP 120/80 | HR 60 | Ht 68.0 in | Wt 198.2 lb

## 2023-09-10 DIAGNOSIS — E559 Vitamin D deficiency, unspecified: Secondary | ICD-10-CM | POA: Diagnosis not present

## 2023-09-10 DIAGNOSIS — D72819 Decreased white blood cell count, unspecified: Secondary | ICD-10-CM

## 2023-09-10 DIAGNOSIS — D696 Thrombocytopenia, unspecified: Secondary | ICD-10-CM

## 2023-09-10 DIAGNOSIS — I10 Essential (primary) hypertension: Secondary | ICD-10-CM | POA: Diagnosis not present

## 2023-09-10 DIAGNOSIS — Z23 Encounter for immunization: Secondary | ICD-10-CM

## 2023-09-10 MED ORDER — HYDROCHLOROTHIAZIDE 25 MG PO TABS
12.5000 mg | ORAL_TABLET | Freq: Every day | ORAL | Status: DC
Start: 2023-09-10 — End: 2024-02-18

## 2023-09-10 NOTE — Assessment & Plan Note (Signed)
S/p 12 weeks of prescription vitamin D, and is back on 5000 IU of D3 daily.  Pt requests level be rechecked today.

## 2023-09-10 NOTE — Assessment & Plan Note (Signed)
Reviewed his BPs and meds. No role for every other day dosing of hydrochlorothiazide. I recommended continuing daily 12.5 mg, staying hydrated, monitoring BP. If he has more issues with dizziness, and if BP is low, we can do a trial off meds. He states he still has 12.5mg  dose at home, and is willing to cut the 25 mg tablets that he has. Reviewed low sodium diet; encouraged continued regular exercise and weight loss. To return in February for med check, but can reach out sooner with any questions/concerns and can send in his BP readings.

## 2023-09-10 NOTE — Patient Instructions (Signed)
  We discussed options of either a trial off of the medication, or taking it daily (there is no role for taking it every other day).  Given that your blood pressure was great today, with you having taken the medication, and that your BP remained >110 even while taking it daily, I recommend taking the 12.5 mg every day. Be sure drink plenty of water--being dehydrated can contribute to feeling lightheaded or woozy. If you continue to lose weight, if have more issues with dizziness, please let me know, and send me what your blood pressure are running, and we can likely do a trial of stopping the medication at that time. We often see lots of fluctuations in weight, and exercise patterns and sodium intake related to winter/seasons (more soups, colder, less exercise, etc), so continue the 12.5 mg dose for now. If you gain weight and see your blood pressure more consistently over 130/80, then we need to reconsider increasing the dose to 25 mg as we previously had discussed (in June).

## 2023-09-11 LAB — VITAMIN D 25 HYDROXY (VIT D DEFICIENCY, FRACTURES): Vit D, 25-Hydroxy: 41.7 ng/mL (ref 30.0–100.0)

## 2024-01-27 ENCOUNTER — Encounter: Payer: 59 | Admitting: Family Medicine

## 2024-02-17 NOTE — Progress Notes (Unsigned)
 No chief complaint on file.  Hypertension: hydrochlorothiazide was increased to 25 mg at his physical in June due to elevated BP's. At his visit in [place] reported he never started the 25mg  dose, stayed at the 12.5 mg dose. He improved his diet and exercised more. He started to lose some weight, and BP's improved.  Prior to his last visit he started feeling woozy, and he cut the dose to every other day.  BP's were normal (not too low). He was encouraged to stay well hydrated, and to take the 12.5 mg dose daily.  He continues to try and follow low sodium diet. Fast food?  (Last visit mentioned [employer], salty burger, not drinking as much water since weather cooler)  BP's have been running  Wt Readings from Last 3 Encounters:  09/10/23 198 lb 3.2 oz (89.9 kg)  05/27/23 211 lb 9.6 oz (96 kg)  02/04/23 211 lb 6.4 oz (95.9 kg)     Exercise--Walks during lunch hour, weights and sprints in his garage.      Vitamin D deficiency:  Last level was 41.7 in October, having taken 5000 IU daily for 2 weeks, after completing Rx.  Prior level in 05/2023 was low at 23.7 (despite taking 5000 IU of D3 daily).   He is currently taking 5000 IU daily.    PMH, PSH, SH reviewed   ROS:  no fever, chills, URI symptoms, headaches, dizziness, chest pain, palpitations, edema.  Denies muscle cramps.  No bleeding, bruising, rash. Moods are good    PHYSICAL EXAM:  There were no vitals taken for this visit.  Wt Readings from Last 3 Encounters:  09/10/23 198 lb 3.2 oz (89.9 kg)  05/27/23 211 lb 9.6 oz (96 kg)  02/04/23 211 lb 6.4 oz (95.9 kg)   Well appearing male, in no distress HEENT: conjunctiva and sclera clear, EOMI Neck: no lymphadenopathy or thyromegaly Heart: regular rate and rhythm Lungs: clear bilaterally Abdomen: soft, nontender, no organomegaly or mass Extremities: no edema Psych: normal mood, affect, hygiene and grooming Neuro: alert and oriented, normal  gait    ASSESSMENT/PLAN:   Does he need hydrochlorothiazide 12.5 mg dose? Is he currently taking 1/2 tablet of 25 mg?  Prevnar 20 now vs at CPE?  F/u as scheduled for CPE in July.

## 2024-02-18 ENCOUNTER — Encounter: Payer: Self-pay | Admitting: Family Medicine

## 2024-02-18 ENCOUNTER — Ambulatory Visit: Payer: 59 | Admitting: Family Medicine

## 2024-02-18 VITALS — BP 130/80 | HR 80 | Ht 68.0 in | Wt 216.6 lb

## 2024-02-18 DIAGNOSIS — I10 Essential (primary) hypertension: Secondary | ICD-10-CM | POA: Diagnosis not present

## 2024-02-18 DIAGNOSIS — Z5181 Encounter for therapeutic drug level monitoring: Secondary | ICD-10-CM

## 2024-02-18 DIAGNOSIS — E559 Vitamin D deficiency, unspecified: Secondary | ICD-10-CM

## 2024-02-18 DIAGNOSIS — Z6832 Body mass index (BMI) 32.0-32.9, adult: Secondary | ICD-10-CM | POA: Diagnosis not present

## 2024-02-18 DIAGNOSIS — M545 Low back pain, unspecified: Secondary | ICD-10-CM

## 2024-02-18 MED ORDER — HYDROCHLOROTHIAZIDE 25 MG PO TABS
25.0000 mg | ORAL_TABLET | Freq: Every day | ORAL | 2 refills | Status: DC
Start: 2024-02-18 — End: 2024-05-30

## 2024-02-18 NOTE — Patient Instructions (Signed)
 Continue low sodium diet. Continue to get back to your prior exercise routine and lose the weight again. If your blood pressure drops to <110/60 and/or if you have a lot of dizziness associated with lower blood pressures, you can go back to 12.5 mg dose and monitor your blood pressure closely. The goal is to maintain a blood pressure of <130/80.  Do core exercises regularly. Use heat, stretches and massage to see if this helps your back.

## 2024-02-19 ENCOUNTER — Encounter: Payer: Self-pay | Admitting: Family Medicine

## 2024-02-19 LAB — BASIC METABOLIC PANEL
BUN/Creatinine Ratio: 12 (ref 9–20)
BUN: 12 mg/dL (ref 6–24)
CO2: 24 mmol/L (ref 20–29)
Calcium: 9.1 mg/dL (ref 8.7–10.2)
Chloride: 103 mmol/L (ref 96–106)
Creatinine, Ser: 0.99 mg/dL (ref 0.76–1.27)
Glucose: 94 mg/dL (ref 70–99)
Potassium: 4.3 mmol/L (ref 3.5–5.2)
Sodium: 139 mmol/L (ref 134–144)
eGFR: 92 mL/min/{1.73_m2} (ref >59)

## 2024-02-19 LAB — VITAMIN D 25 HYDROXY (VIT D DEFICIENCY, FRACTURES): Vit D, 25-Hydroxy: 41.6 ng/mL (ref 30.0–100.0)

## 2024-05-29 ENCOUNTER — Other Ambulatory Visit: Payer: Self-pay | Admitting: Family Medicine

## 2024-05-29 DIAGNOSIS — I10 Essential (primary) hypertension: Secondary | ICD-10-CM

## 2024-06-01 NOTE — Progress Notes (Unsigned)
 No chief complaint on file.  James Townsend is a 53 y.o. male who presents for a complete physical.  He has the following concerns:  Hypertension follow-up:  hydrochlorothiazide  dose was increased to 25 mg in December 2024, when he saw higher BP's at home. Previously had done very well on 12.5 mg dose when diet was better, exercise more, had lost some weight.   In 10/2023 he reported gaining weight, eating saltier foods,not walking as much, eating more fast food ([employer], bad breakfast foods). His weight had gone up to 214#. By his March visit, he was getting back on track--cut back on sodium, fast food, was back to walking during his lunch hour.  BP's have been running  BP Readings from Last 3 Encounters:  02/18/24 130/80  09/10/23 120/80  05/27/23 136/86    Denies headaches, chest pain, shortness of breath, edema, muscle cramps.    Vitamin D  deficiency:  Last level was 41.6 in 01/2024, when he had been taking 10,000 IU daily.  Previously had been low at 23.7 (despite taking 5000 IU of D3 daily) in 05/2023.SABRA   Component Ref Range & Units (hover) 3 mo ago (02/18/24) 8 mo ago (09/10/23) 1 yr ago (05/27/23) 1 yr ago (06/18/22) 2 yr ago (12/25/21) 2 yr ago (06/12/21) 3 yr ago (06/07/20)  Vit D, 25-Hydroxy 41.6 41.7 CM 23.7 Low  CM 37.6 CM 20.5 Low  CM 22.0 Low  CM 17.9 Low  CM    BPH:  He previously took flomax  daily, without side effects, and would get up once at night to void. At his physical last year, he was taking it just sporadically, about once a week.  He had been getting up 3x/night to void, which he related to drinking sweet tea in the evenings.  He had reported some urinary frequency when drinking coffee and water.  Rarely he had some sensation that he didn't empty well, and presses on his bladder, which helps.   Today he reports ***    Prediabetes--A1c was noted to be 5.9 in 2020 (with normal fasting glucose). After cutting back on bread, limiting sweets, A1c improved.  Last  A1c was 5.6% in 05/2023.  Sweet tea? He has an occasional regular soda (one every 2 weeks). He is still limiting his carbs, sweets. He continues intermittent fasting, eating 12 until 7 pm, but sometimes will continue to 8:30-9. He mostly doesn't eat bread (just with cookouts/buns), some fried fish.  ***UPDATE ALL    Immunization History  Administered Date(s) Administered   Influenza, Seasonal, Injecte, Preservative Fre 09/10/2023   Influenza,inj,Quad PF,6+ Mos 08/30/2020, 07/24/2021   Influenza-Unspecified 09/07/2017, 09/01/2019   PFIZER(Purple Top)SARS-COV-2 Vaccination 02/11/2020, 03/06/2020, 11/23/2020   PPD Test 09/04/2014   Pfizer Covid-19 Vaccine Bivalent Booster 77yrs & up 12/25/2021   Tdap 06/01/2019   Zoster Recombinant(Shingrix ) 04/16/2022, 06/18/2022   Last colonoscopy: 08/2020, normal; recheck 10 yrs Last PSA:  Lab Results  Component Value Date   PSA1 0.9 05/27/2023   PSA1 0.7 06/18/2022   PSA1 1.0 06/12/2021   PSA 0.60 07/11/2015   PSA 0.57 12/14/2014   PSA 0.62 12/02/2013   Dentist: every 6 months Ophtho: every year Exercise:     Walking 3 miles 3x/week (18 min mile), occasional weights. Very active on the job (lots of walking, pulling/pushing/lifting).  Lipid: Lab Results  Component Value Date   CHOL 175 05/27/2023   HDL 79 05/27/2023   LDLCALC 84 05/27/2023   TRIG 65 05/27/2023   CHOLHDL 2.2 05/27/2023  PMH, PSH, SH and FH were reviewed and updated     ROS:  The patient denies anorexia, headaches, vision loss, decreased hearing, ear pain, hoarseness, chest pain, palpitations, dizziness, syncope, dyspnea on exertion, swelling, nausea, vomiting, diarrhea, constipation, abdominal pain, melena, hematochezia, indigestion/heartburn, hematuria, incontinence, weakened urine stream, dysuria, genital lesions, joint pains, numbness, weakness, tremor, suspicious skin lesions, depression, anxiety, abnormal bleeding/bruising, or enlarged lymph nodes. Denies ED  or decreased libido. L elbow discomfort per HPI. Urinary symptoms/nocturia per HPI. Some itching at his bilateral elbows only at night.  Hydrocortisone helps.  Only rarely getting the tingling in his upper back like he used to get.   PHYSICAL EXAM:  There were no vitals taken for this visit.  Wt Readings from Last 3 Encounters:  02/18/24 216 lb 9.6 oz (98.2 kg)  09/10/23 198 lb 3.2 oz (89.9 kg)  05/27/23 211 lb 9.6 oz (96 kg)    General Appearance:    Alert, cooperative, no distress, appears stated age.  Head:    Normocephalic, without obvious abnormality, atraumatic  Eyes:    PERRL, conjunctiva clear, EOM's intact, fundi benign. Pterygium medially at the left eye, unchanged  Ears:    Normal TM's and external ear canals  Nose:   Normal, no drainage or sinus tenderness  Throat:   Normal, no lesions or erythema  Neck:   Supple, no lymphadenopathy;  thyroid:  no enlargement/ tenderness/nodules; no carotid bruit or JVD  Back:    Spine nontender, no curvature, ROM normal, no CVA  tenderness.   Lungs:     Clear bilaterally. Good air movement, without wheezes or ronchi; respirations unlabored  Chest Wall:    No tenderness or deformity   Heart:    Regular rate and rhythm, S1 and S2 normal, no murmur, rub or gallop  Breast Exam:    No chest wall tenderness, masses or gynecomastia  Abdomen:     Soft, non-tender, nondistended, normoactive bowel sounds, no masses, no hepatosplenomegaly  Genitalia:    Normal male external genitalia without lesions.  Testicles without masses, normal size.  No inguinal hernias.  Rectal:    Normal sphincter tone, no masses or tenderness; guaiac negative stool.  Prostate smooth, no nodules, mild-mod enlarged.  Extremities:   No clubbing, cyanosis or edema. He was tender at the lateral forearm muscle, no palpable spasm. He had pain with L wrist flexion against resistance. Nontender at L lateral epicondyle.  Pulses:   2+ and symmetric all extremities  Skin:   Skin  color, texture, turgor normal, no rashes or lesions (just very slightly hyperpigmented in arms near elbows where gets itchy at night)  Lymph nodes:   Cervical, supraclavicular, and inguinal nodes normal  Neurologic:   Normal strength, sensation and gait; reflexes are somewhat diminished, symmetric throughout                           Psych:   Normal mood, affect, hygiene and grooming.  Urine--trace leuks, otherwise normal  Lab Results  Component Value Date   HGBA1C 5.6 05/27/2023     ASSESSMENT/PLAN:   Lipids optional, can discuss, all normal previously Last year taking flomax  only sporadically, ? If RF needed, ?taking daily?  U/a please--dx HTN and BPH with nocturia  Discussed PSA screening (risks/benefits), recommended at least 30 minutes of aerobic activity at least 5 days/week, weight-bearing exercise at least 2x/wk; proper sunscreen use reviewed; healthy diet and alcohol recommendations (less than or equal to 2  drinks/day) reviewed; regular seatbelt use; changing batteries in smoke detectors.  Immunization recommendations discussed--encouraged to get yearly flu shots.  Updated bivalent COVID vaccine is recommended in the Fall.   Prevnar-20 *** Colonoscopy recommendations reviewed, UTD.   Med check 6 mos, CPE 1 year

## 2024-06-02 ENCOUNTER — Ambulatory Visit: Payer: 59 | Admitting: Family Medicine

## 2024-06-02 ENCOUNTER — Encounter: Payer: Self-pay | Admitting: Family Medicine

## 2024-06-02 ENCOUNTER — Ambulatory Visit: Payer: Self-pay | Admitting: Family Medicine

## 2024-06-02 VITALS — BP 130/80 | HR 64 | Ht 68.0 in | Wt 217.4 lb

## 2024-06-02 DIAGNOSIS — R7303 Prediabetes: Secondary | ICD-10-CM | POA: Diagnosis not present

## 2024-06-02 DIAGNOSIS — N401 Enlarged prostate with lower urinary tract symptoms: Secondary | ICD-10-CM | POA: Diagnosis not present

## 2024-06-02 DIAGNOSIS — Z0000 Encounter for general adult medical examination without abnormal findings: Secondary | ICD-10-CM

## 2024-06-02 DIAGNOSIS — I10 Essential (primary) hypertension: Secondary | ICD-10-CM

## 2024-06-02 DIAGNOSIS — Z125 Encounter for screening for malignant neoplasm of prostate: Secondary | ICD-10-CM | POA: Diagnosis not present

## 2024-06-02 DIAGNOSIS — R351 Nocturia: Secondary | ICD-10-CM

## 2024-06-02 DIAGNOSIS — Z23 Encounter for immunization: Secondary | ICD-10-CM

## 2024-06-02 LAB — POCT GLYCOSYLATED HEMOGLOBIN (HGB A1C): Hemoglobin A1C: 5.7 % — AB (ref 4.0–5.6)

## 2024-06-02 LAB — POCT URINALYSIS DIP (PROADVANTAGE DEVICE)
Bilirubin, UA: NEGATIVE
Blood, UA: NEGATIVE
Glucose, UA: NEGATIVE mg/dL
Ketones, POC UA: NEGATIVE mg/dL
Leukocytes, UA: NEGATIVE
Nitrite, UA: NEGATIVE
Protein Ur, POC: NEGATIVE mg/dL
Specific Gravity, Urine: 1.005
Urobilinogen, Ur: 0.2
pH, UA: 7.5 (ref 5.0–8.0)

## 2024-06-02 MED ORDER — AMLODIPINE BESYLATE 5 MG PO TABS: 5.0000 mg | ORAL_TABLET | Freq: Every day | ORAL | 1 refills | Status: DC

## 2024-06-02 NOTE — Patient Instructions (Addendum)
  HEALTH MAINTENANCE RECOMMENDATIONS:  It is recommended that you get at least 30 minutes of aerobic exercise at least 5 days/week (for weight loss, you may need as much as 60-90 minutes). This can be any activity that gets your heart rate up. This can be divided in 10-15 minute intervals if needed, but try and build up your endurance at least once a week.  Weight bearing exercise is also recommended twice weekly.  Eat a healthy diet with lots of vegetables, fruits and fiber.  Colorful foods have a lot of vitamins (ie green vegetables, tomatoes, red peppers, etc).  Limit sweet tea, regular sodas and alcoholic beverages, all of which has a lot of calories and sugar.  Up to 1 alcoholic drink daily may be beneficial for women (unless trying to lose weight, watch sugars).  Drink a lot of water.  Calcium recommendations are 1200-1500 mg daily (1500 mg for postmenopausal women or women without ovaries), and vitamin D  1000 IU daily.  This should be obtained from diet and/or supplements (vitamins), and calcium should not be taken all at once, but in divided doses.  Monthly self breast exams and yearly mammograms for women over the age of 70 is recommended.  Sunscreen of at least SPF 30 should be used on all sun-exposed parts of the skin when outside between the hours of 10 am and 4 pm (not just when at beach or pool, but even with exercise, golf, tennis, and yard work!)  Use a sunscreen that says broad spectrum so it covers both UVA and UVB rays, and make sure to reapply every 1-2 hours.  Remember to change the batteries in your smoke detectors when changing your clock times in the spring and fall. Carbon monoxide detectors are recommended for your home.  Use your seat belt every time you are in a car, and please drive safely and not be distracted with cell phones and texting while driving.    Due to your issues with dizziness, which is likely related to the diuretic, we are switching your blood  pressure medication. Stop the hydrochlorothiazide  25 mg. Instead, start amlodipine 5 mg. Please monitor your blood pressure regularly, and record on a piece of paper. Send us  your blood pressures in 2 weeks. This will help us  decide if your dose needs to be adjusted. Continue to limit the sodium in your diet. If you develop side effects or issues, please contact us .  It is best to contact us  via MyChart.  Please cut out (or at least cut down the volume or frequency) the sweet tea at lunch, and drink water instead.  In general, whole grains are better than white--whole wheat breads, brown rice, whole grain pasta is better than the white stuff.  Consider getting a COVID booster when updated in the Fall. Continue yearly flu shots. We discussed getting the pneumonia shot Prevnar-20.  Consider getting this at a follow-up visit, since you didn't want it today.

## 2024-06-03 LAB — COMPREHENSIVE METABOLIC PANEL WITH GFR
ALT: 26 IU/L (ref 0–44)
AST: 20 IU/L (ref 0–40)
Albumin: 4.7 g/dL (ref 3.8–4.9)
Alkaline Phosphatase: 80 IU/L (ref 44–121)
BUN/Creatinine Ratio: 10 (ref 9–20)
BUN: 11 mg/dL (ref 6–24)
Bilirubin Total: 0.3 mg/dL (ref 0.0–1.2)
CO2: 25 mmol/L (ref 20–29)
Calcium: 9.3 mg/dL (ref 8.7–10.2)
Chloride: 97 mmol/L (ref 96–106)
Creatinine, Ser: 1.11 mg/dL (ref 0.76–1.27)
Globulin, Total: 2.7 g/dL (ref 1.5–4.5)
Glucose: 91 mg/dL (ref 70–99)
Potassium: 4.1 mmol/L (ref 3.5–5.2)
Sodium: 136 mmol/L (ref 134–144)
Total Protein: 7.4 g/dL (ref 6.0–8.5)
eGFR: 80 mL/min/1.73 (ref >59)

## 2024-06-03 LAB — CBC WITH DIFFERENTIAL/PLATELET
Basophils Absolute: 0 x10E3/uL (ref 0.0–0.2)
Basos: 1 %
EOS (ABSOLUTE): 0.1 x10E3/uL (ref 0.0–0.4)
Eos: 3 %
Hematocrit: 45.5 % (ref 37.5–51.0)
Hemoglobin: 14.2 g/dL (ref 13.0–17.7)
Immature Grans (Abs): 0 x10E3/uL (ref 0.0–0.1)
Immature Granulocytes: 0 %
Lymphocytes Absolute: 1.7 x10E3/uL (ref 0.7–3.1)
Lymphs: 43 %
MCH: 27 pg (ref 26.6–33.0)
MCHC: 31.2 g/dL — ABNORMAL LOW (ref 31.5–35.7)
MCV: 87 fL (ref 79–97)
Monocytes Absolute: 0.4 x10E3/uL (ref 0.1–0.9)
Monocytes: 10 %
Neutrophils Absolute: 1.7 x10E3/uL (ref 1.4–7.0)
Neutrophils: 43 %
Platelets: 128 x10E3/uL — ABNORMAL LOW (ref 150–450)
RBC: 5.26 x10E6/uL (ref 4.14–5.80)
RDW: 14.1 % (ref 11.6–15.4)
WBC: 4.1 x10E3/uL (ref 3.4–10.8)

## 2024-06-03 LAB — LIPID PANEL
Chol/HDL Ratio: 2.5 ratio (ref 0.0–5.0)
Cholesterol, Total: 198 mg/dL (ref 100–199)
HDL: 79 mg/dL (ref >39)
LDL Chol Calc (NIH): 105 mg/dL — ABNORMAL HIGH (ref 0–99)
Triglycerides: 79 mg/dL (ref 0–149)
VLDL Cholesterol Cal: 14 mg/dL (ref 5–40)

## 2024-06-03 LAB — PSA: Prostate Specific Ag, Serum: 0.7 ng/mL (ref 0.0–4.0)

## 2024-06-06 ENCOUNTER — Encounter: Payer: Self-pay | Admitting: Family Medicine

## 2024-06-12 ENCOUNTER — Other Ambulatory Visit: Payer: Self-pay | Admitting: Family Medicine

## 2024-06-12 DIAGNOSIS — N401 Enlarged prostate with lower urinary tract symptoms: Secondary | ICD-10-CM

## 2024-06-24 ENCOUNTER — Other Ambulatory Visit: Payer: Self-pay | Admitting: Family Medicine

## 2024-06-24 DIAGNOSIS — I10 Essential (primary) hypertension: Secondary | ICD-10-CM

## 2024-07-05 ENCOUNTER — Encounter: Payer: Self-pay | Admitting: Family Medicine

## 2024-07-05 DIAGNOSIS — I10 Essential (primary) hypertension: Secondary | ICD-10-CM

## 2024-07-05 MED ORDER — AMLODIPINE BESYLATE 5 MG PO TABS: 5.0000 mg | ORAL_TABLET | Freq: Every day | ORAL | 0 refills | Status: DC

## 2024-07-05 MED ORDER — HYDROCHLOROTHIAZIDE 25 MG PO TABS: 25.0000 mg | ORAL_TABLET | Freq: Every day | ORAL | 0 refills | Status: AC

## 2024-08-08 ENCOUNTER — Encounter: Admitting: Family Medicine

## 2024-10-13 ENCOUNTER — Other Ambulatory Visit: Payer: Self-pay | Admitting: Family Medicine

## 2024-10-13 DIAGNOSIS — I10 Essential (primary) hypertension: Secondary | ICD-10-CM

## 2025-06-21 ENCOUNTER — Encounter: Payer: Self-pay | Admitting: Family Medicine
